# Patient Record
Sex: Female | Born: 1955 | Race: White | Hispanic: No | Smoking: Never smoker
Health system: Southern US, Community
[De-identification: ages and names within clinical notes are randomized; demographics above are authoritative.]

## PROBLEM LIST (undated history)

## (undated) DIAGNOSIS — E349 Endocrine disorder, unspecified: Secondary | ICD-10-CM

## (undated) DIAGNOSIS — T7840XA Allergy, unspecified, initial encounter: Secondary | ICD-10-CM

## (undated) DIAGNOSIS — M549 Dorsalgia, unspecified: Secondary | ICD-10-CM

## (undated) DIAGNOSIS — N3281 Overactive bladder: Secondary | ICD-10-CM

## (undated) DIAGNOSIS — G8929 Other chronic pain: Secondary | ICD-10-CM

## (undated) DIAGNOSIS — E079 Disorder of thyroid, unspecified: Secondary | ICD-10-CM

## (undated) DIAGNOSIS — R011 Cardiac murmur, unspecified: Secondary | ICD-10-CM

## (undated) DIAGNOSIS — C801 Malignant (primary) neoplasm, unspecified: Secondary | ICD-10-CM

## (undated) DIAGNOSIS — F419 Anxiety disorder, unspecified: Secondary | ICD-10-CM

## (undated) DIAGNOSIS — F32A Depression, unspecified: Secondary | ICD-10-CM

## (undated) DIAGNOSIS — I10 Essential (primary) hypertension: Secondary | ICD-10-CM

## (undated) DIAGNOSIS — E7800 Pure hypercholesterolemia, unspecified: Secondary | ICD-10-CM

## (undated) DIAGNOSIS — K219 Gastro-esophageal reflux disease without esophagitis: Secondary | ICD-10-CM

## (undated) DIAGNOSIS — J45909 Unspecified asthma, uncomplicated: Secondary | ICD-10-CM

## (undated) DIAGNOSIS — F329 Major depressive disorder, single episode, unspecified: Secondary | ICD-10-CM

## (undated) HISTORY — DX: Pure hypercholesterolemia, unspecified: E78.00

## (undated) HISTORY — DX: Other chronic pain: G89.29

## (undated) HISTORY — PX: TONSILLECTOMY AND ADENOIDECTOMY: SHX28

## (undated) HISTORY — DX: Allergy, unspecified, initial encounter: T78.40XA

## (undated) HISTORY — DX: Essential (primary) hypertension: I10

## (undated) HISTORY — PX: EXTERNAL EAR SURGERY: SHX627

## (undated) HISTORY — DX: Overactive bladder: N32.81

## (undated) HISTORY — DX: Malignant (primary) neoplasm, unspecified: C80.1

## (undated) HISTORY — DX: Depression, unspecified: F32.A

## (undated) HISTORY — DX: Cardiac murmur, unspecified: R01.1

## (undated) HISTORY — DX: Major depressive disorder, single episode, unspecified: F32.9

## (undated) HISTORY — PX: ABDOMINAL HYSTERECTOMY: SHX81

## (undated) HISTORY — PX: INNER EAR SURGERY: SHX679

## (undated) HISTORY — DX: Endocrine disorder, unspecified: E34.9

## (undated) HISTORY — PX: TUBAL LIGATION: SHX77

## (undated) HISTORY — DX: Gastro-esophageal reflux disease without esophagitis: K21.9

## (undated) HISTORY — DX: Unspecified asthma, uncomplicated: J45.909

## (undated) HISTORY — DX: Anxiety disorder, unspecified: F41.9

## (undated) HISTORY — DX: Dorsalgia, unspecified: M54.9

## (undated) HISTORY — DX: Disorder of thyroid, unspecified: E07.9

---

## 1998-12-12 ENCOUNTER — Encounter: Payer: Self-pay | Admitting: Family Medicine

## 1998-12-12 ENCOUNTER — Ambulatory Visit (HOSPITAL_COMMUNITY): Admission: RE | Admit: 1998-12-12 | Discharge: 1998-12-12 | Payer: Self-pay | Admitting: Family Medicine

## 1999-03-25 ENCOUNTER — Other Ambulatory Visit: Admission: RE | Admit: 1999-03-25 | Discharge: 1999-03-25 | Payer: Self-pay | Admitting: Gynecology

## 1999-12-18 ENCOUNTER — Encounter: Payer: Self-pay | Admitting: Gastroenterology

## 1999-12-18 ENCOUNTER — Ambulatory Visit (HOSPITAL_COMMUNITY): Admission: RE | Admit: 1999-12-18 | Discharge: 1999-12-18 | Payer: Self-pay | Admitting: Gastroenterology

## 2000-03-17 ENCOUNTER — Ambulatory Visit (HOSPITAL_COMMUNITY): Admission: RE | Admit: 2000-03-17 | Discharge: 2000-03-17 | Payer: Self-pay | Admitting: Gastroenterology

## 2000-03-17 ENCOUNTER — Encounter: Payer: Self-pay | Admitting: Gastroenterology

## 2000-05-02 ENCOUNTER — Encounter: Admission: RE | Admit: 2000-05-02 | Discharge: 2000-05-02 | Payer: Self-pay | Admitting: *Deleted

## 2000-05-13 ENCOUNTER — Other Ambulatory Visit: Admission: RE | Admit: 2000-05-13 | Discharge: 2000-05-13 | Payer: Self-pay | Admitting: Gynecology

## 2000-11-23 ENCOUNTER — Encounter: Payer: Self-pay | Admitting: Family Medicine

## 2000-11-23 ENCOUNTER — Ambulatory Visit (HOSPITAL_COMMUNITY): Admission: RE | Admit: 2000-11-23 | Discharge: 2000-11-23 | Payer: Self-pay | Admitting: Family Medicine

## 2000-12-20 ENCOUNTER — Encounter: Admission: RE | Admit: 2000-12-20 | Discharge: 2000-12-20 | Payer: Self-pay | Admitting: General Surgery

## 2000-12-20 ENCOUNTER — Encounter: Payer: Self-pay | Admitting: General Surgery

## 2000-12-27 ENCOUNTER — Encounter: Payer: Self-pay | Admitting: General Surgery

## 2000-12-27 ENCOUNTER — Encounter (INDEPENDENT_AMBULATORY_CARE_PROVIDER_SITE_OTHER): Payer: Self-pay | Admitting: Specialist

## 2000-12-27 ENCOUNTER — Ambulatory Visit (HOSPITAL_COMMUNITY): Admission: RE | Admit: 2000-12-27 | Discharge: 2000-12-27 | Payer: Self-pay | Admitting: General Surgery

## 2001-01-30 ENCOUNTER — Observation Stay (HOSPITAL_COMMUNITY): Admission: RE | Admit: 2001-01-30 | Discharge: 2001-01-31 | Payer: Self-pay

## 2001-01-30 ENCOUNTER — Encounter (INDEPENDENT_AMBULATORY_CARE_PROVIDER_SITE_OTHER): Payer: Self-pay

## 2001-05-03 ENCOUNTER — Encounter: Admission: RE | Admit: 2001-05-03 | Discharge: 2001-05-03 | Payer: Self-pay | Admitting: Gynecology

## 2001-05-03 ENCOUNTER — Encounter: Payer: Self-pay | Admitting: Gynecology

## 2001-05-24 ENCOUNTER — Other Ambulatory Visit: Admission: RE | Admit: 2001-05-24 | Discharge: 2001-05-24 | Payer: Self-pay | Admitting: Gynecology

## 2002-03-12 ENCOUNTER — Encounter: Admission: RE | Admit: 2002-03-12 | Discharge: 2002-06-10 | Payer: Self-pay | Admitting: Family Medicine

## 2002-05-17 ENCOUNTER — Encounter: Payer: Self-pay | Admitting: Gynecology

## 2002-05-17 ENCOUNTER — Encounter: Admission: RE | Admit: 2002-05-17 | Discharge: 2002-05-17 | Payer: Self-pay | Admitting: Gynecology

## 2002-05-24 ENCOUNTER — Other Ambulatory Visit: Admission: RE | Admit: 2002-05-24 | Discharge: 2002-05-24 | Payer: Self-pay | Admitting: Gynecology

## 2002-06-12 ENCOUNTER — Encounter: Payer: Self-pay | Admitting: Gynecology

## 2002-06-12 ENCOUNTER — Encounter: Admission: RE | Admit: 2002-06-12 | Discharge: 2002-06-12 | Payer: Self-pay | Admitting: Gynecology

## 2003-05-21 ENCOUNTER — Encounter: Admission: RE | Admit: 2003-05-21 | Discharge: 2003-05-21 | Payer: Self-pay | Admitting: Gynecology

## 2003-05-21 ENCOUNTER — Encounter: Payer: Self-pay | Admitting: Gynecology

## 2003-09-17 ENCOUNTER — Other Ambulatory Visit: Admission: RE | Admit: 2003-09-17 | Discharge: 2003-09-17 | Payer: Self-pay | Admitting: Gynecology

## 2004-06-09 ENCOUNTER — Encounter: Admission: RE | Admit: 2004-06-09 | Discharge: 2004-06-09 | Payer: Self-pay | Admitting: Gynecology

## 2004-10-12 ENCOUNTER — Encounter: Admission: RE | Admit: 2004-10-12 | Discharge: 2004-10-12 | Payer: Self-pay | Admitting: Otolaryngology

## 2004-10-23 ENCOUNTER — Other Ambulatory Visit: Admission: RE | Admit: 2004-10-23 | Discharge: 2004-10-23 | Payer: Self-pay | Admitting: Gynecology

## 2005-07-13 ENCOUNTER — Encounter: Admission: RE | Admit: 2005-07-13 | Discharge: 2005-07-13 | Payer: Self-pay | Admitting: Gynecology

## 2005-11-09 ENCOUNTER — Other Ambulatory Visit: Admission: RE | Admit: 2005-11-09 | Discharge: 2005-11-09 | Payer: Self-pay | Admitting: Gynecology

## 2006-07-18 ENCOUNTER — Encounter: Admission: RE | Admit: 2006-07-18 | Discharge: 2006-07-18 | Payer: Self-pay | Admitting: Gynecology

## 2006-11-23 ENCOUNTER — Other Ambulatory Visit: Admission: RE | Admit: 2006-11-23 | Discharge: 2006-11-23 | Payer: Self-pay | Admitting: Gynecology

## 2006-12-30 ENCOUNTER — Encounter: Admission: RE | Admit: 2006-12-30 | Discharge: 2006-12-30 | Payer: Self-pay | Admitting: Gastroenterology

## 2007-07-20 ENCOUNTER — Encounter: Admission: RE | Admit: 2007-07-20 | Discharge: 2007-07-20 | Payer: Self-pay | Admitting: Gynecology

## 2007-08-22 IMAGING — CR DG UGI W/ SMALL BOWEL HIGH DENSITY
18 of 24 series · 18 of 24 positions shown · non-contrast
Comparison: [HOSPITAL] abdominal and pelvic CT report, 11/23/00.

CLINICAL DATA: Slight upper esophageal intermittent dysphagia (previous thyroid surgery).  Intermittent blood in stools and spitting up blood.  Alternating constipation and diarrhea, right flank pain, positive H. pylori.  Diet-controlled diabetes.
UPPER GI WITH SMALL BOWEL FOLLOW THROUGH, HIGH DENSITY:
TECHNIQUE: Double and single contrast barium.
The preliminary abdominal radiograph is unremarkable.
The esophagus is normal in appearance. No intrinsic nor extrinsic esophageal lesion is seen, specifically in region of prior thyroidectomy.  Minimal tertiary contractions are seen with normal primary peristaltic wave of barium swallow.  Ingested 13 mm barium tablet readily passes through stomach.  There is no evidence of hiatal hernia.  Esophageal motility is within normal limits. The stomach, duodenal bulb, and remainder of the duodenum are normal in appearance.  Minimal water siphon induced gastroesophageal reflux was demonstrated.
Small bowel transit  time was normal at 40 minutes.  There were no abnormally dilated loops of large or small bowel present.  The mucosal pattern of the small bowel was normal.  The terminal ileum was adequately visualized and was normal in appearance. Appendix is normal.

[run (1 of 17)]
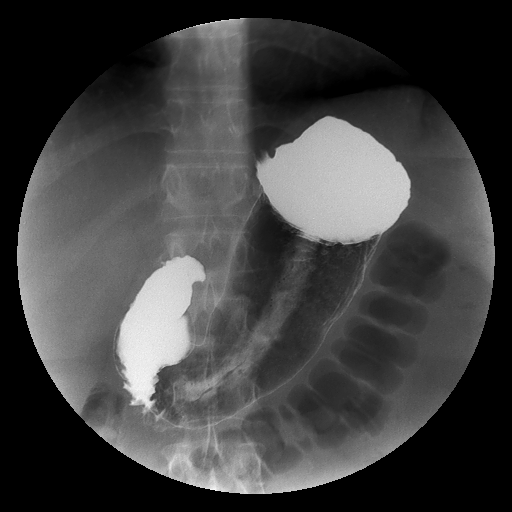

[run (2 of 17)]
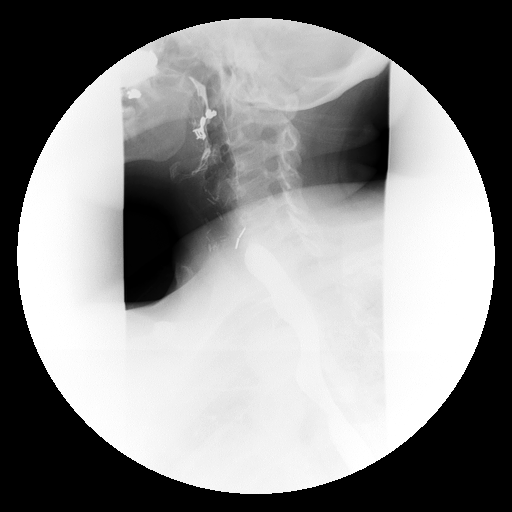

[run (3 of 17)]
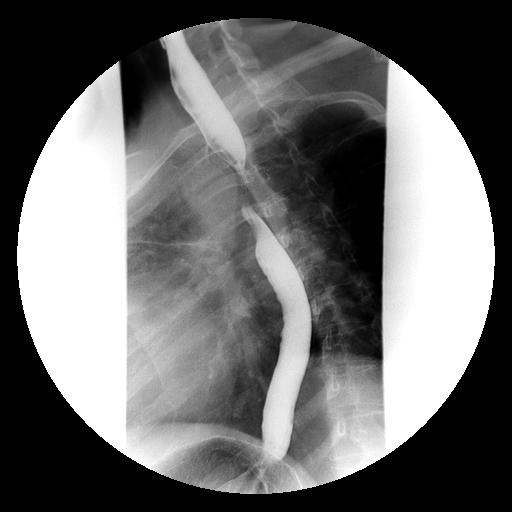

[run (4 of 17)]
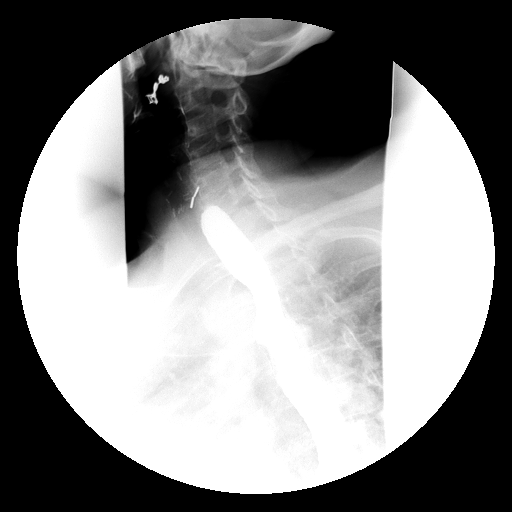

[run (5 of 17)]
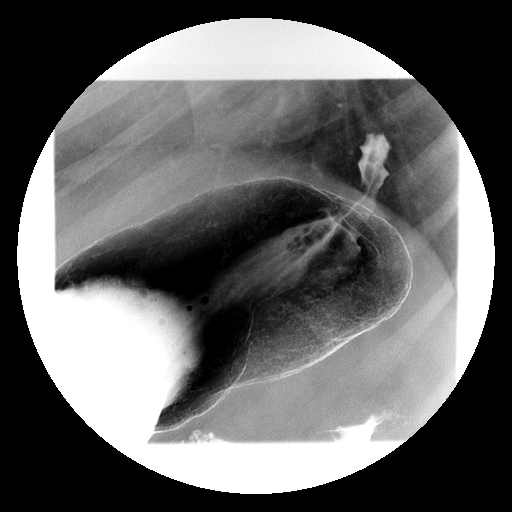

[run (6 of 17)]
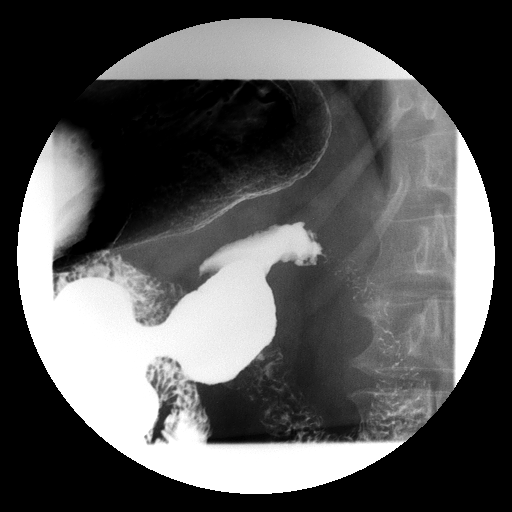

[run (7 of 17)]
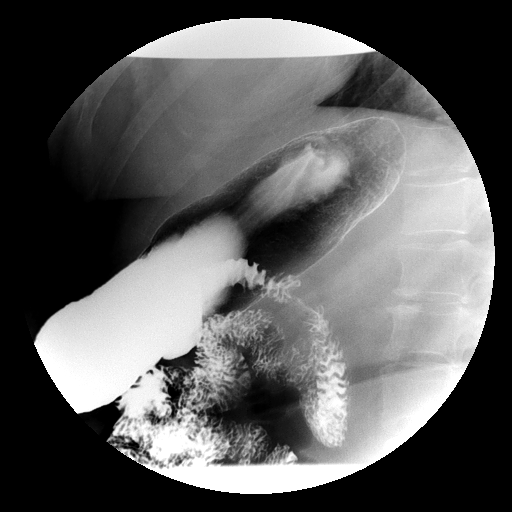

[run (8 of 17)]
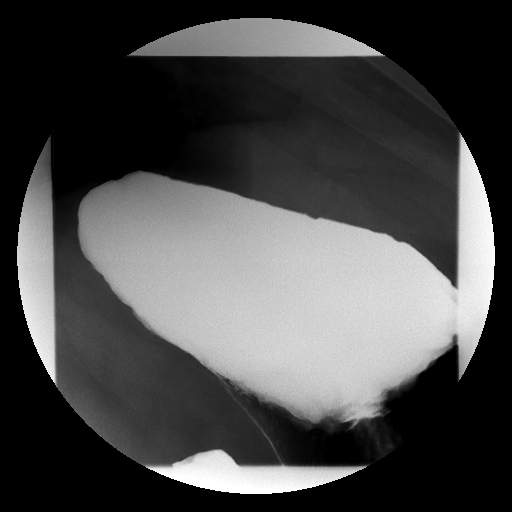

[run (9 of 17)]
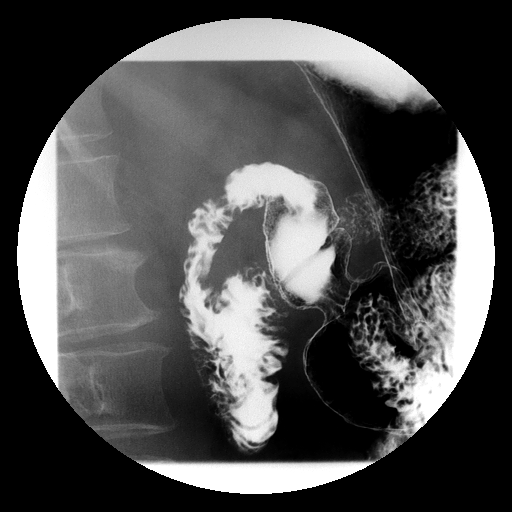

[run (10 of 17)]
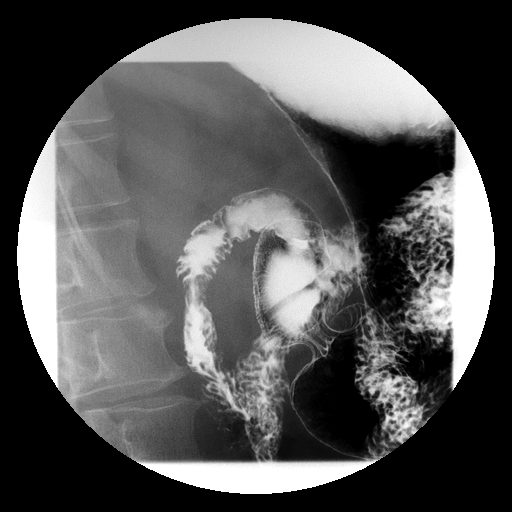

[run (11 of 17)]
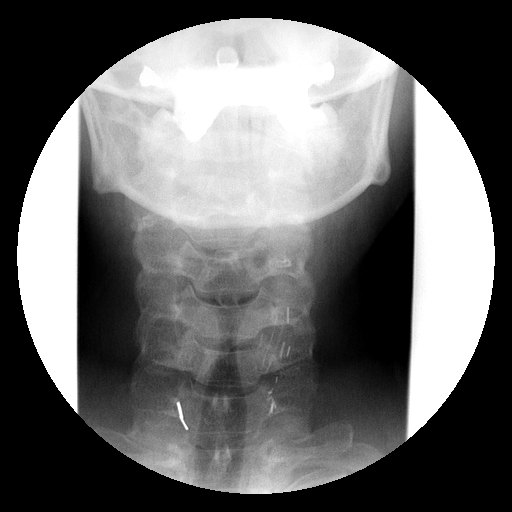

[run (12 of 17)]
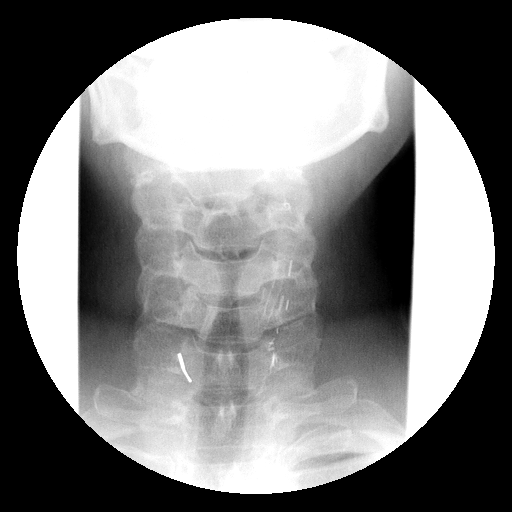

[run (13 of 17)]
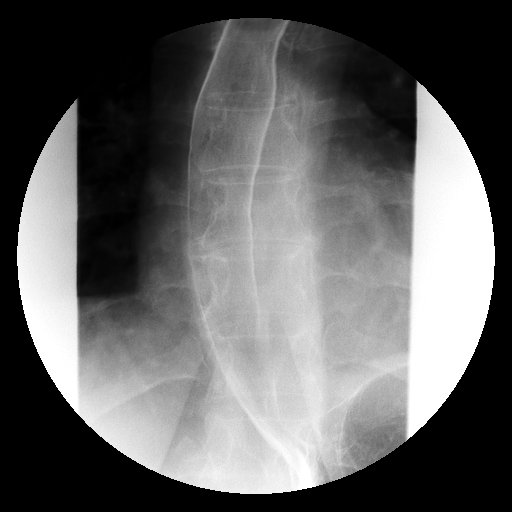

[run (14 of 17)]
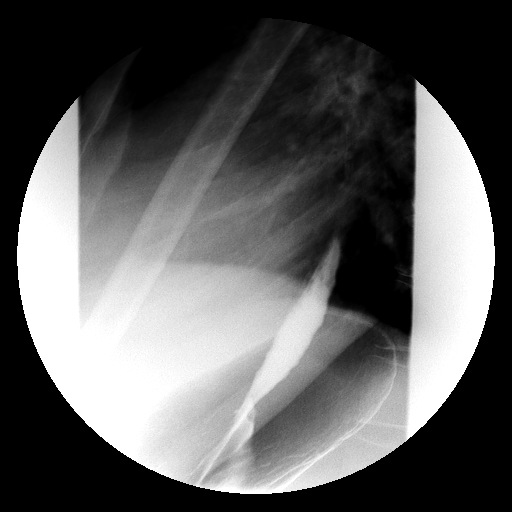

[run (15 of 17)]
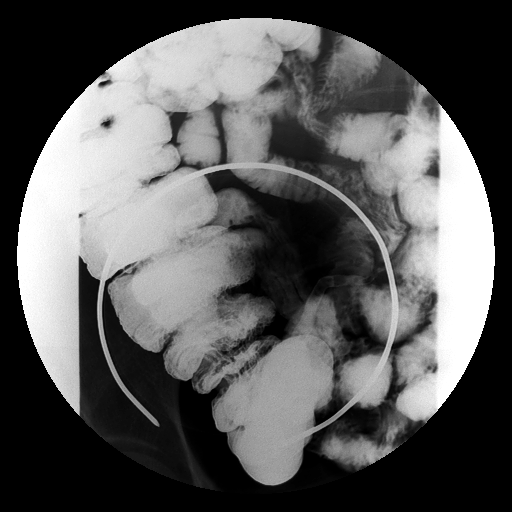

[run (16 of 17)]
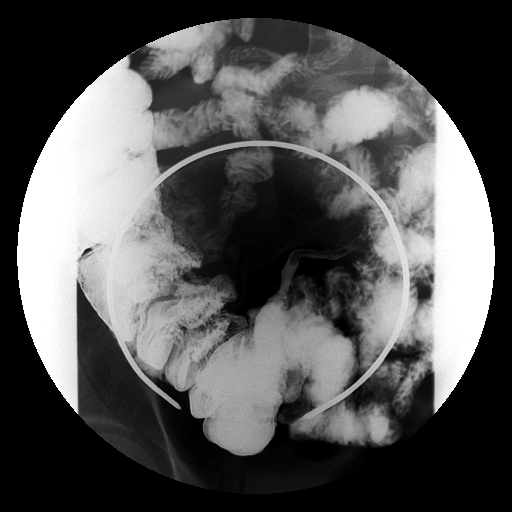

[run (17 of 17)]
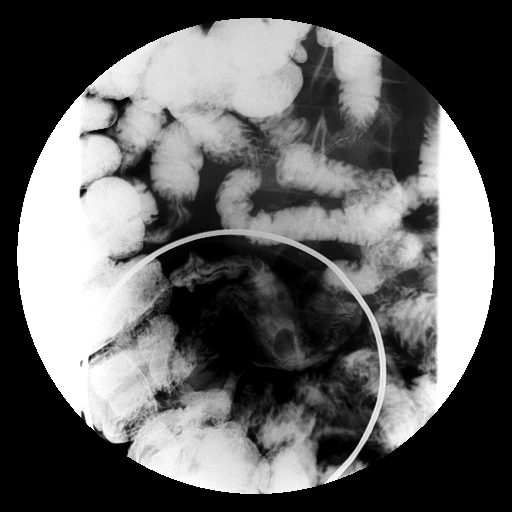

[view not recorded]
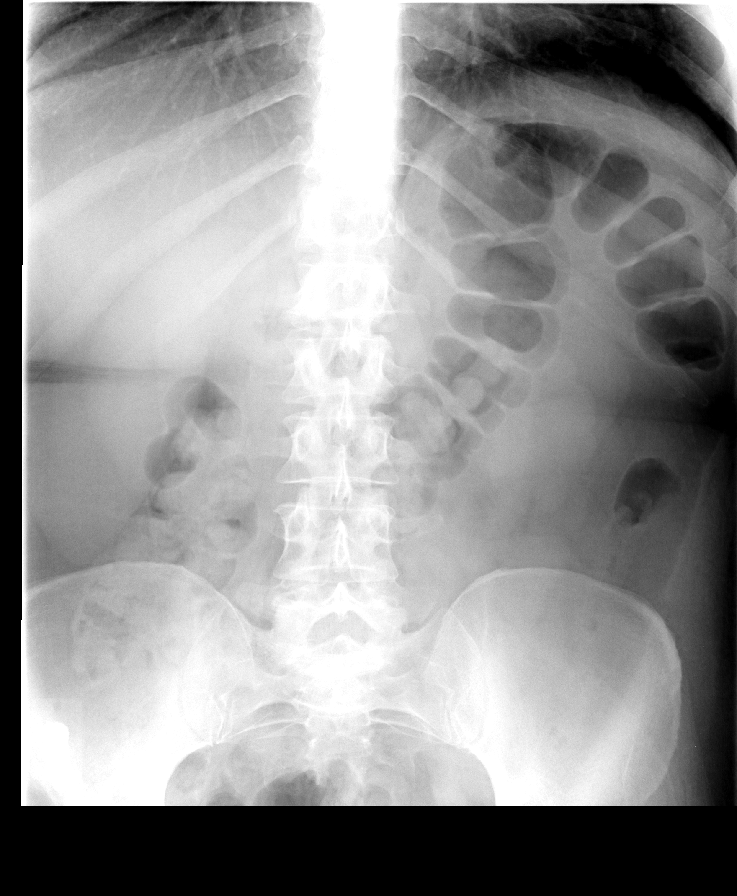

[18 of 24 positions shown; findings below may reference images not displayed]

IMPRESSION: Minimal esophageal tertiary contractions and minimal water siphon induced gastroesophageal reflux. Otherwise, normal upper gi and small bowel series.

## 2007-11-24 ENCOUNTER — Other Ambulatory Visit: Admission: RE | Admit: 2007-11-24 | Discharge: 2007-11-24 | Payer: Self-pay | Admitting: Gynecology

## 2008-07-22 ENCOUNTER — Encounter: Admission: RE | Admit: 2008-07-22 | Discharge: 2008-07-22 | Payer: Self-pay | Admitting: Gynecology

## 2008-11-27 ENCOUNTER — Encounter: Payer: Self-pay | Admitting: Gynecology

## 2008-11-27 ENCOUNTER — Ambulatory Visit: Payer: Self-pay | Admitting: Gynecology

## 2008-11-27 ENCOUNTER — Other Ambulatory Visit: Admission: RE | Admit: 2008-11-27 | Discharge: 2008-11-27 | Payer: Self-pay | Admitting: Gynecology

## 2008-12-04 ENCOUNTER — Ambulatory Visit: Payer: Self-pay | Admitting: Gynecology

## 2009-07-23 ENCOUNTER — Encounter: Admission: RE | Admit: 2009-07-23 | Discharge: 2009-07-23 | Payer: Self-pay | Admitting: Gynecology

## 2009-12-10 ENCOUNTER — Other Ambulatory Visit: Admission: RE | Admit: 2009-12-10 | Discharge: 2009-12-10 | Payer: Self-pay | Admitting: Gynecology

## 2009-12-10 ENCOUNTER — Ambulatory Visit: Payer: Self-pay | Admitting: Gynecology

## 2009-12-19 ENCOUNTER — Ambulatory Visit: Payer: Self-pay | Admitting: Gynecology

## 2009-12-29 ENCOUNTER — Ambulatory Visit: Payer: Self-pay | Admitting: Gynecology

## 2010-01-01 ENCOUNTER — Encounter: Admission: RE | Admit: 2010-01-01 | Discharge: 2010-01-01 | Payer: Self-pay | Admitting: Family Medicine

## 2010-02-17 ENCOUNTER — Ambulatory Visit: Payer: Self-pay | Admitting: Gynecology

## 2010-07-16 HISTORY — PX: COLONOSCOPY: SHX174

## 2010-07-29 ENCOUNTER — Encounter: Admission: RE | Admit: 2010-07-29 | Discharge: 2010-07-29 | Payer: Self-pay | Admitting: Gynecology

## 2011-01-15 ENCOUNTER — Other Ambulatory Visit: Payer: Self-pay | Admitting: Otolaryngology

## 2011-01-15 DIAGNOSIS — J329 Chronic sinusitis, unspecified: Secondary | ICD-10-CM

## 2011-01-21 ENCOUNTER — Other Ambulatory Visit (HOSPITAL_COMMUNITY)
Admission: RE | Admit: 2011-01-21 | Discharge: 2011-01-21 | Disposition: A | Discharge status: Home / Self Care | Payer: BC Managed Care – PPO | Source: Ambulatory Visit | Attending: Gynecology | Admitting: Gynecology

## 2011-01-21 ENCOUNTER — Ambulatory Visit
Admission: RE | Admit: 2011-01-21 | Discharge: 2011-01-21 | Disposition: A | Discharge status: Home / Self Care | Payer: BC Managed Care – PPO | Source: Ambulatory Visit | Attending: Otolaryngology | Admitting: Otolaryngology

## 2011-01-21 ENCOUNTER — Encounter (INDEPENDENT_AMBULATORY_CARE_PROVIDER_SITE_OTHER): Payer: BC Managed Care – PPO | Admitting: Gynecology

## 2011-01-21 ENCOUNTER — Other Ambulatory Visit: Payer: Self-pay | Admitting: Gynecology

## 2011-01-21 DIAGNOSIS — J329 Chronic sinusitis, unspecified: Secondary | ICD-10-CM

## 2011-01-21 DIAGNOSIS — Z01419 Encounter for gynecological examination (general) (routine) without abnormal findings: Secondary | ICD-10-CM

## 2011-01-21 DIAGNOSIS — Z1211 Encounter for screening for malignant neoplasm of colon: Secondary | ICD-10-CM

## 2011-01-21 DIAGNOSIS — Z124 Encounter for screening for malignant neoplasm of cervix: Secondary | ICD-10-CM | POA: Insufficient documentation

## 2011-01-29 ENCOUNTER — Other Ambulatory Visit (INDEPENDENT_AMBULATORY_CARE_PROVIDER_SITE_OTHER): Payer: BC Managed Care – PPO

## 2011-01-29 DIAGNOSIS — R7309 Other abnormal glucose: Secondary | ICD-10-CM

## 2011-02-05 NOTE — Op Note (Signed)
[facility]  Patient:    Carrie Macias, Carrie Macias                       MRN: [id number] Proc. Date: 01/30/01 Adm. Date:  [id number] Attending:  Gennie Alma CC:         Arvella Merles, M.D.   Operative Report  [place] Number:  [phone]  PREOPERATIVE DIAGNOSES: 1. 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 2. 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  POSTOPERATIVE DIAGNOSES: 1. Hashimotos thyroiditis, left lobe of thyroid. 2. 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  OPERATION:  Left thyroid lobectomy.  SURGEON:  Milus Mallick, M.D.  ASSISTANTRiley Lam A. Magnus Ivan, M.D.  ANESTHESIA:  General endotracheal.  DESCRIPTION OF PROCEDURE:  Under adequate general endotracheal anesthesia, the patients neck was extended over a shoulder roll and then prepared and draped in the usual fashion.  There was a low collar incisional scar present.  An incision was made in the previous scar and carried down through the scar tissue to the fascia of the neck.  Bleeders were electrocoagulated.  One significant vein was divided over hemostats which were ligated with 3-0 silk. The deep fascia was incised, and then the strap muscles of the left side of the neck were divided, exposing the surgical plane of the left lobe and the thyroid.  It was somewhat laden with adhesions, but these could be divided with blunt and some sharp dissection.  A significant middle thyroid vein was exposed and divided between quadruple clip technique.  This allowed the left lobe of the thyroid to be located and retracted anteriorly and medially.  The left inferior parathyroid gland was identified and spared of any injury during the subsequent dissection.  The inferior pole attachments were divided over hemostats, and in some instances, electrocoagulated.  Next the superior pole vessels were divided over  a 3-0 silk ligature and clip applied to the stay side and a ligature of 3-0 silk through the specimen side.  This gave a great deal of mobility to the left lobe of the thyroid, and it was dissected up off of the surrounding structures.  The branches of the inferior thyroid artery were divided over hemoclips.  The dissection was carried out on the capsule of the thyroid, and at no time was the recurrent laryngeal nerve encountered.  The ligament of Allyson Sabal was divided with Bovie electrocoagulation, and the small area of attachment to the trachea was also divided similarly.  The specimen was removed from the operative field and sent for frozen section study which revealed Hashimotos thyroiditis.  Hemostasis was ascertained.  There was a little oozing from the area which the recurrent laryngeal nerve usually runs, and this was controlled with Surgicel.  The superior parathyroid gland was never encountered.  The strap muscles were repaired with interrupted sutures of 4-0 Vicryl.  The subcutaneous tissue of the neck was similarly repaired.  The skin was approximated with the generic skin staples 35-W.  Sterile dressing was applied.  Estimated blood loss for the procedure was approximately 50 cc.  The patient tolerated the procedure well and left the operating room in satisfactory condition. DD:  01/30/01 TD:  01/30/01 Job: [id number]

## 2011-03-05 ENCOUNTER — Ambulatory Visit
Admission: RE | Admit: 2011-03-05 | Discharge: 2011-03-05 | Disposition: A | Discharge status: Home / Self Care | Payer: BC Managed Care – PPO | Source: Ambulatory Visit | Attending: Family Medicine | Admitting: Family Medicine

## 2011-03-05 ENCOUNTER — Other Ambulatory Visit: Payer: Self-pay | Admitting: Family Medicine

## 2011-03-05 DIAGNOSIS — I10 Essential (primary) hypertension: Secondary | ICD-10-CM

## 2011-05-13 ENCOUNTER — Other Ambulatory Visit: Payer: Self-pay | Admitting: Otolaryngology

## 2011-07-27 ENCOUNTER — Other Ambulatory Visit: Payer: Self-pay | Admitting: Gynecology

## 2011-07-27 DIAGNOSIS — Z1231 Encounter for screening mammogram for malignant neoplasm of breast: Secondary | ICD-10-CM

## 2011-08-06 ENCOUNTER — Ambulatory Visit
Admission: RE | Admit: 2011-08-06 | Discharge: 2011-08-06 | Disposition: A | Discharge status: Home / Self Care | Payer: BC Managed Care – PPO | Source: Ambulatory Visit | Attending: Gynecology | Admitting: Gynecology

## 2011-08-06 DIAGNOSIS — Z1231 Encounter for screening mammogram for malignant neoplasm of breast: Secondary | ICD-10-CM

## 2011-10-26 IMAGING — CR DG CHEST 2V
2 series · 2 of 2 positions shown · non-contrast
Comparison: [HOSPITAL] at [REDACTED] [HOSPITAL] chest x-ray
report 12/20/2000.

CLINICAL DATA: Hypertension, preop for years surgery.  History
thyroid cancer, hypertension, diabetes, nonsmoker, no current chest
complaints.

CHEST - 2 VIEW

[view not recorded (1 of 2)]
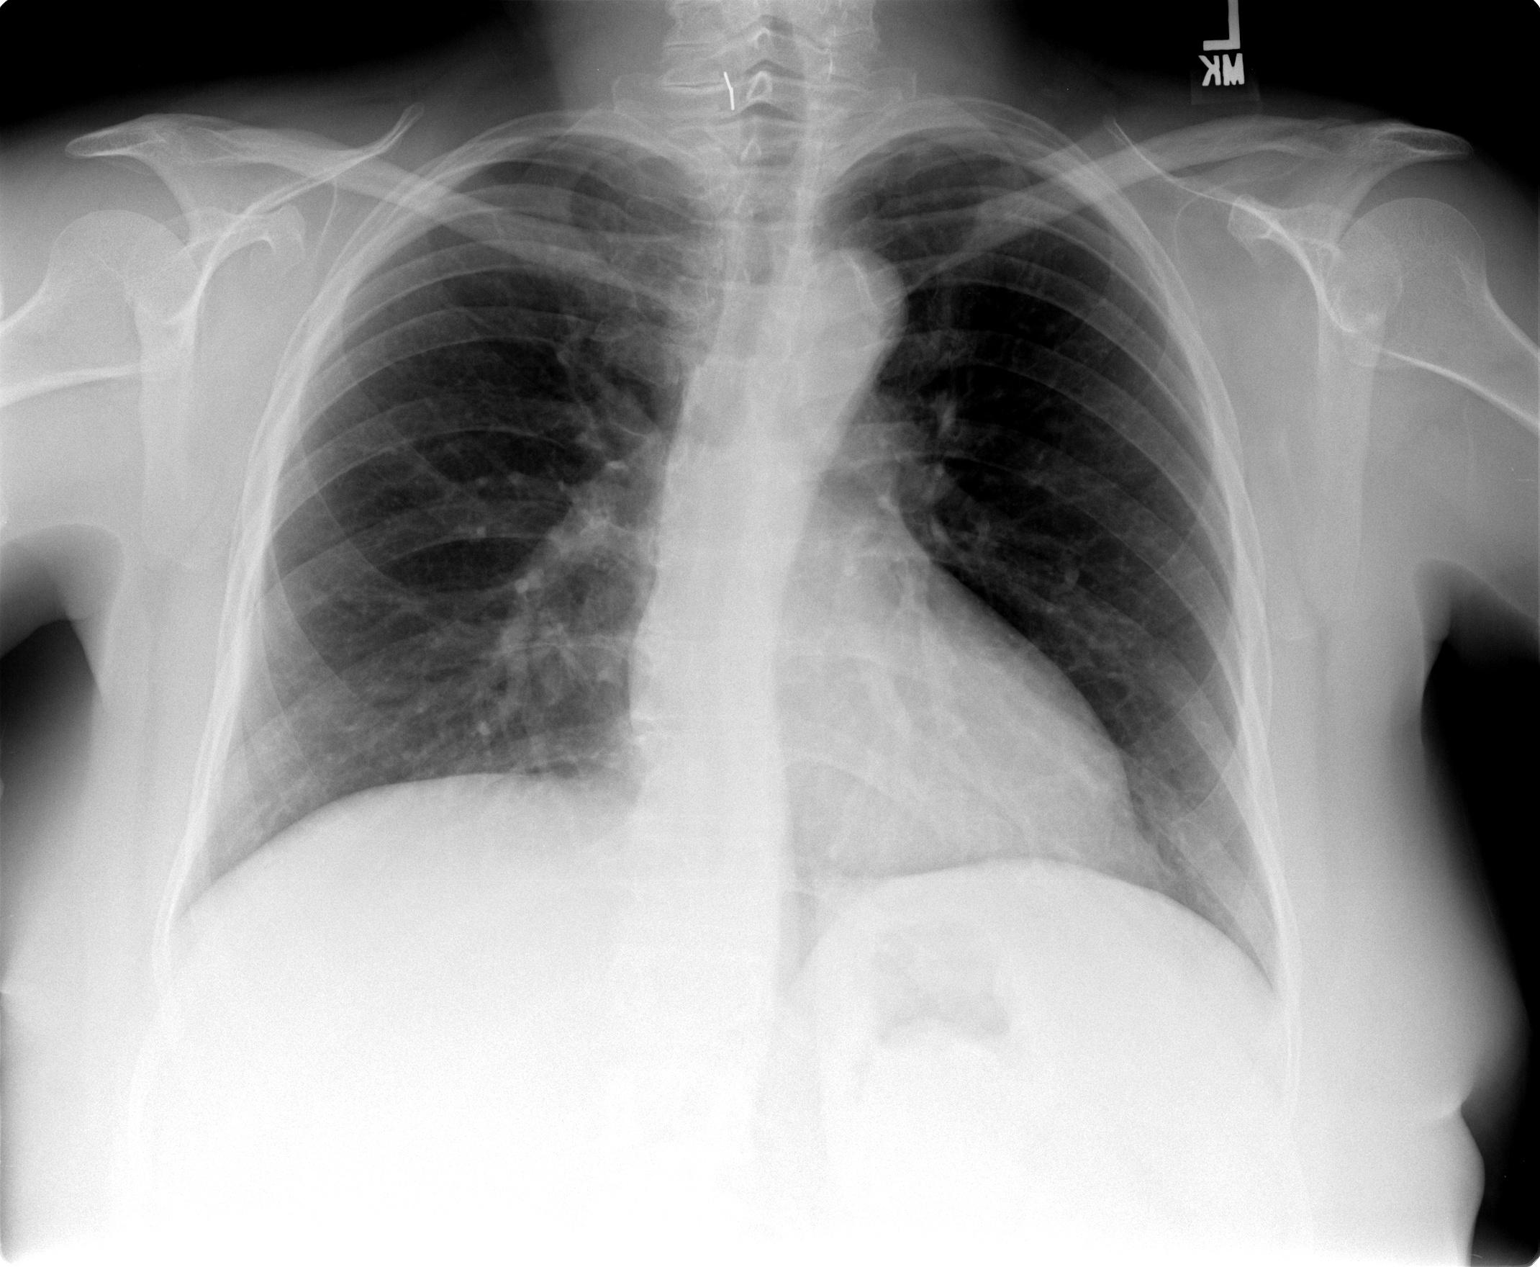

[view not recorded (2 of 2)]
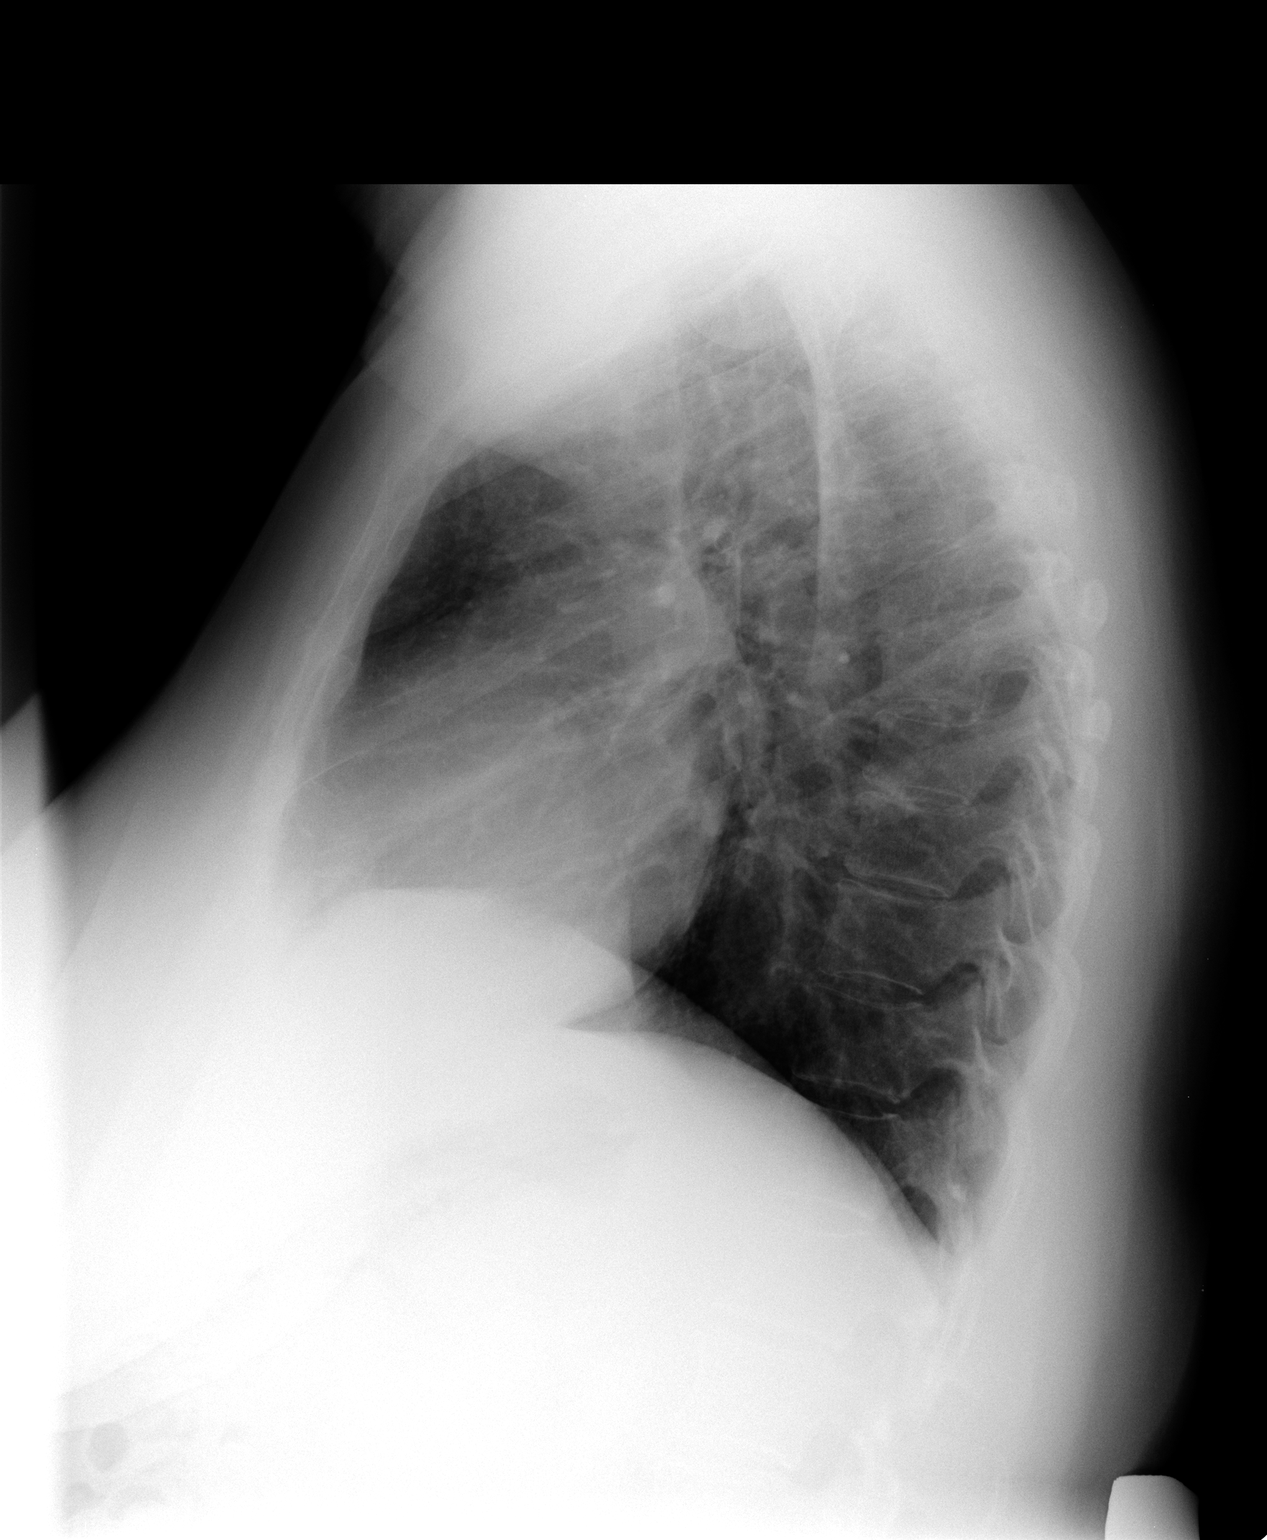

[2 of 2 positions shown; findings below may reference images not displayed]

FINDINGS: Surgical clip right thyroid bed visualized.

Lungs are clear.

Heart size normal.

Mediastinum, hila, pleura and osseous structures are unremarkable.
IMPRESSION: No active cardiopulmonary or metastatic disease.

## 2012-01-24 DIAGNOSIS — E7800 Pure hypercholesterolemia, unspecified: Secondary | ICD-10-CM | POA: Insufficient documentation

## 2012-01-24 DIAGNOSIS — G8929 Other chronic pain: Secondary | ICD-10-CM | POA: Insufficient documentation

## 2012-01-27 ENCOUNTER — Ambulatory Visit (INDEPENDENT_AMBULATORY_CARE_PROVIDER_SITE_OTHER): Payer: BC Managed Care – PPO | Admitting: Gynecology

## 2012-01-27 ENCOUNTER — Encounter: Payer: Self-pay | Admitting: Gynecology

## 2012-01-27 VITALS — BP 144/92 | Ht 63.5 in | Wt 168.0 lb

## 2012-01-27 DIAGNOSIS — N951 Menopausal and female climacteric states: Secondary | ICD-10-CM

## 2012-01-27 DIAGNOSIS — Z87898 Personal history of other specified conditions: Secondary | ICD-10-CM

## 2012-01-27 DIAGNOSIS — Z01419 Encounter for gynecological examination (general) (routine) without abnormal findings: Secondary | ICD-10-CM

## 2012-01-27 DIAGNOSIS — Z8639 Personal history of other endocrine, nutritional and metabolic disease: Secondary | ICD-10-CM

## 2012-01-27 MED ORDER — ESTRADIOL 1 MG PO TABS: 1.0000 mg | ORAL_TABLET | Freq: Every day | ORAL | Status: DC

## 2012-01-27 NOTE — Patient Instructions (Signed)
Remember to take your medication today.

## 2012-01-27 NOTE — Progress Notes (Signed)
Carrie Macias 07-Dec-1955 [id number]   History:    56 y.o.  for annual exam with complaints of hot flashes and sweating. Patient had been on elestrin transdermal estrogen but had discontinued because of the cost. Patient with prior history of laparoscopic vaginal hysterectomy with right salpingo-oophorectomy along with a suburethral sling and anterior colporrhaphy. Review of her record indicates she was weighing 172 now weighing 168 she started to exercise more. Review of her record indicated that her blood pressures a was 144/92 (she did not take her medications as morning) last Pap smear was in 2012. Her primary physician that all her lab work recently. She does have history vitamin D deficiency this was not tested. She is currently taking calcium and vitamin D. Her last bone density study was in 2011 with her lowest T score at the right femoral neck with a value of -1.3 (decrease mineralization/osteopenia). Her last mammogram was in November 2012 which was normal and she does her monthly self breast examination.  Past medical history,surgical history, family history and social history were all reviewed and documented in the EPIC chart.  Gynecologic History No LMP recorded. Patient has had a hysterectomy. Contraception: none Last Pap: 2012. Results were: normal Last mammogram: 2012. Results were: normal  Obstetric History OB History    Grav Para Term Preterm Abortions TAB SAB Ect Mult Living   3 2 2  1  1   2      # Outc Date GA Lbr Len/2nd Wgt Sex Del Anes PTL Lv   1 TRM     M SVD  No Yes   2 SAB            3 TRM     F SVD  No Yes       ROS:  Was performed and pertinent positives and negatives are included in the history.  Exam: chaperone present  BP 144/92  Ht 5' 3.5" (1.613 m)  Wt 168 lb (76.204 kg)  BMI 29.29 kg/m2  Body mass index is 29.29 kg/(m^2).  General appearance : Well developed well nourished female. No acute distress HEENT: Neck supple, trachea midline, no carotid  bruits, no thyroidmegaly Lungs: Clear to auscultation, no rhonchi or wheezes, or rib retractions  Heart: Regular rate and rhythm, no murmurs or gallops Breast:Examined in sitting and supine position were symmetrical in appearance, no palpable masses or tenderness,  no skin retraction, no nipple inversion, no nipple discharge, no skin discoloration, no axillary or supraclavicular lymphadenopathy Abdomen: no palpable masses or tenderness, no rebound or guarding Extremities: no edema or skin discoloration or tenderness  Pelvic:  Bartholin, Urethra, Skene Glands: Within normal limits             Vagina: No gross lesions or discharge  Cervix: Absent  Uterus absent Adnexa  Without masses or tenderness  Anus and perineum  normal   Rectovaginal  normal sphincter tone without palpated masses or tenderness             Hemoccult cards provided     Assessment/Plan:  56 y.o. female for annual exam with menopausal symptoms worsened recently as a result of discontinuation of her transdermal elestrin due to cost. Patient will be placed on Estrace 1 mg by mouth daily generic form and she will start that today. She was reminded to start taking her blood pressure medication today that she forgot. Her last colonoscopy was normal in 2011. Requisition to schedule her bone density was provided. We'll check her vitamin D  level. New Pap smear screening guidelines discussed. Patient will no longer need Pap smears. She's had normal Pap smears all her life and has had a hysterectomy for benign pathology. Her mammogram is due at the end of the year she was reminded to continue to do her monthly self breast examination and to submit to the office the Hemoccult cards provided to her today.    Ok Edwards MD, 10:11 AM 01/27/2012

## 2012-01-28 ENCOUNTER — Other Ambulatory Visit: Payer: Self-pay | Admitting: *Deleted

## 2012-01-28 MED ORDER — ESTRADIOL 1 MG PO TABS: 1.0000 mg | ORAL_TABLET | Freq: Every day | ORAL | Status: DC

## 2012-02-03 ENCOUNTER — Telehealth: Payer: Self-pay | Admitting: *Deleted

## 2012-02-03 NOTE — Telephone Encounter (Signed)
Pt called stating pharmacy never rx for estradiol 1 mg daily called in to pharmacy. Pt informed.

## 2012-02-10 ENCOUNTER — Ambulatory Visit (INDEPENDENT_AMBULATORY_CARE_PROVIDER_SITE_OTHER): Payer: BC Managed Care – PPO

## 2012-02-10 ENCOUNTER — Telehealth: Payer: Self-pay | Admitting: *Deleted

## 2012-02-10 DIAGNOSIS — M8580 Other specified disorders of bone density and structure, unspecified site: Secondary | ICD-10-CM

## 2012-02-10 DIAGNOSIS — Z8639 Personal history of other endocrine, nutritional and metabolic disease: Secondary | ICD-10-CM

## 2012-02-10 DIAGNOSIS — N951 Menopausal and female climacteric states: Secondary | ICD-10-CM

## 2012-02-10 DIAGNOSIS — M899 Disorder of bone, unspecified: Secondary | ICD-10-CM

## 2012-02-10 NOTE — Telephone Encounter (Signed)
Pt spoke with wendy regarding if JF could fill out disability papers due to back issues? Pt would like to file for disability, Left message for pt to call

## 2012-02-11 NOTE — Telephone Encounter (Signed)
Spoke with pt regarding the below note and told her that JF would not be able to fill out paper for her back issues. Pt said she will follow up with PCP. Dr.harris office.

## 2012-02-17 ENCOUNTER — Encounter: Payer: Self-pay | Admitting: Gynecology

## 2012-06-29 ENCOUNTER — Other Ambulatory Visit: Payer: Self-pay | Admitting: Gynecology

## 2012-06-29 DIAGNOSIS — Z1231 Encounter for screening mammogram for malignant neoplasm of breast: Secondary | ICD-10-CM

## 2012-08-09 ENCOUNTER — Ambulatory Visit
Admission: RE | Admit: 2012-08-09 | Discharge: 2012-08-09 | Disposition: A | Discharge status: Home / Self Care | Payer: BC Managed Care – PPO | Source: Ambulatory Visit | Attending: Gynecology | Admitting: Gynecology

## 2012-08-09 DIAGNOSIS — Z1231 Encounter for screening mammogram for malignant neoplasm of breast: Secondary | ICD-10-CM

## 2012-10-15 ENCOUNTER — Ambulatory Visit (INDEPENDENT_AMBULATORY_CARE_PROVIDER_SITE_OTHER): Payer: BC Managed Care – PPO | Admitting: Emergency Medicine

## 2012-10-15 ENCOUNTER — Ambulatory Visit: Payer: BC Managed Care – PPO

## 2012-10-15 VITALS — BP 169/95 | HR 76 | Temp 97.4°F | Resp 18 | Ht 63.0 in | Wt 178.8 lb

## 2012-10-15 DIAGNOSIS — R059 Cough, unspecified: Secondary | ICD-10-CM

## 2012-10-15 DIAGNOSIS — J209 Acute bronchitis, unspecified: Secondary | ICD-10-CM

## 2012-10-15 DIAGNOSIS — R05 Cough: Secondary | ICD-10-CM

## 2012-10-15 MED ORDER — AZITHROMYCIN 250 MG PO TABS: ORAL_TABLET | ORAL | Status: DC

## 2012-10-15 MED ORDER — HYDROCOD POLST-CHLORPHEN POLST 10-8 MG/5ML PO LQCR: 5.0000 mL | Freq: Two times a day (BID) | ORAL | Status: DC | PRN

## 2012-10-15 NOTE — Progress Notes (Signed)
  Subjective:    Patient ID: Carrie Macias, female    DOB: [birth year], 57 y.o.   MRN: [id number]  HPI    Review of Systems     Objective:   Physical Exam        Assessment & Plan:    [facility] reading (PRIMARY) by  Dr. Dareen Piano.  Negative chest.

## 2012-10-15 NOTE — Progress Notes (Signed)
[facility] [address] [place] [address] [phone]  Date:  10/15/2012   Name:  Carrie Macias   DOB:  1956-01-30   MRN:  [id number]  PCP:  No primary provider on file.    Chief Complaint: URI   History of Present Illness:  Carrie Macias is a 57 y.o. very pleasant female patient who presents with the following:  Ill for one month with a cough.  Has been through two rounds of augmentin.  Has a cough productive of yellow thick sputum not associated with wheezing or shortness of breath.  Moderate nasal congestion and drainage and a post nasal drip with thick dark yellow drainage.  No fever or chills.  Cough is barking.  No sore throat, nausea or vomiting. No other complaints.  Patient Active Problem List  Diagnosis  . Hypercholesterolemia  . Back pain, chronic  . History of vitamin D deficiency    Past Medical History  Diagnosis Date  . Hormone disorder     HYPOTHYROIDISM  . Hypertension   . Detrusor dyssynergia   . Anxiety   . Hypercholesterolemia   . Back pain, chronic   . Diabetes mellitus     TYPE II  . Cancer     THYROID  . Allergy   . Depression   . GERD (gastroesophageal reflux disease)   . Asthma   . Thyroid disease   . Heart murmur     Past Surgical History  Procedure Date  . Tonsillectomy and adenoidectomy   . Tubal ligation   . External ear surgery   . Abdominal hysterectomy     LAVH/RSO/SLING/ANT/COLPO  . Colonoscopy 07/16/2010  . Inner ear surgery     IMPLANT    History  Substance Use Topics  . Smoking status: Never Smoker   . Smokeless tobacco: Never Used  . Alcohol Use: Yes     Comment: SELDOM    Family History  Problem Relation Age of Onset  . Diabetes Mother   . Hypertension Mother   . Heart disease Mother   . Fibromyalgia Mother   . Hypertension Father   . Hypertension Sister   . COPD Sister   . OCD Sister   . Fibromyalgia Sister   . Bipolar disorder Brother   . Bipolar disorder Maternal  Grandfather     Allergies  Allergen Reactions  . Aspirin Other (See Comments)    Sick on stomach  . Ibuprofen Other (See Comments)    Sick on the stomach    Medication list has been reviewed and updated.  Current Outpatient Prescriptions on File Prior to Visit  Medication Sig Dispense Refill  . bisoprolol-hydrochlorothiazide (ZIAC) 5-6.25 MG per tablet Take 1 tablet by mouth daily.      . calcium carbonate (OS-CAL) 600 MG TABS Take 600 mg by mouth 2 (two) times daily with a meal.      . celecoxib (CELEBREX) 200 MG capsule Take 200 mg by mouth 2 (two) times daily.      . cholecalciferol (VITAMIN D) 1000 UNITS tablet Take 1,000 Units by mouth daily.      . citalopram (CELEXA) 20 MG tablet Take 40 mg by mouth daily.       . cyclobenzaprine (FLEXERIL) 10 MG tablet Take 10 mg by mouth 3 (three) times daily as needed.      Marland Kitchen estradiol (ESTRACE) 1 MG tablet Take 1 tablet (1 mg total) by mouth daily.  90 tablet  4  .  levothyroxine (SYNTHROID, LEVOTHROID) 125 MCG tablet Take 125 mcg by mouth daily.      Marland Kitchen losartan (COZAAR) 50 MG tablet Take 50 mg by mouth daily.      . metFORMIN (GLUCOPHAGE) 500 MG tablet Take 500 mg by mouth 2 (two) times daily with a meal.      . Multiple Vitamin (MULTIVITAMIN) tablet Take 1 tablet by mouth daily.      . pantoprazole (PROTONIX) 40 MG tablet Take 40 mg by mouth daily.      . simvastatin (ZOCOR) 80 MG tablet Take 80 mg by mouth at bedtime.      . traMADol (ULTRAM) 50 MG tablet Take 50 mg by mouth every 6 (six) hours as needed.      . zolpidem (AMBIEN) 10 MG tablet Take 10 mg by mouth at bedtime as needed.        Review of Systems:  As per HPI, otherwise negative.    Physical Examination: Filed Vitals:   10/15/12 1047  BP: 169/95  Pulse: 76  Temp: 97.4 F (36.3 C)  Resp: 18   Filed Vitals:   10/15/12 1047  Height: 5\' 3"  (1.6 m)  Weight: 178 lb 12.8 oz (81.103 kg)   Body mass index is 31.67 kg/(m^2). Ideal Body Weight: Weight in (lb) to have  BMI = 25: 140.8   GEN: WDWN, NAD, Non-toxic, A & O x 3 HEENT: Atraumatic, Normocephalic. Neck supple. No masses, No LAD. Ears and Nose: No external deformity. CV: RRR, No M/G/R. No JVD. No thrill. No extra heart sounds. PULM: CTA B, no wheezes, crackles, rhonchi. No retractions. No resp. distress. No accessory muscle use. ABD: S, NT, ND, +BS. No rebound. No HSM. EXTR: No c/c/e NEURO Normal gait.  PSYCH: Normally interactive. Conversant. Not depressed or anxious appearing.  Calm demeanor.    Assessment and Plan: Bronchitis Possible pertussis Labs zithromax mucinex d tussionex Follow up as needed  Carmelina Dane, MD

## 2012-10-18 LAB — BORDETELLA PERTUSSIS ANTIBODY: B pertussis IgG Ab, Quant: 0.2 U/mL (ref 0.0–0.9)

## 2013-02-01 ENCOUNTER — Encounter: Payer: Self-pay | Admitting: Gynecology

## 2013-02-01 ENCOUNTER — Ambulatory Visit (INDEPENDENT_AMBULATORY_CARE_PROVIDER_SITE_OTHER): Payer: BC Managed Care – PPO | Admitting: Gynecology

## 2013-02-01 VITALS — BP 124/78 | Ht 63.0 in | Wt 179.0 lb

## 2013-02-01 DIAGNOSIS — Z01419 Encounter for gynecological examination (general) (routine) without abnormal findings: Secondary | ICD-10-CM

## 2013-02-01 DIAGNOSIS — R300 Dysuria: Secondary | ICD-10-CM

## 2013-02-01 DIAGNOSIS — R140 Abdominal distension (gaseous): Secondary | ICD-10-CM

## 2013-02-01 DIAGNOSIS — Z780 Asymptomatic menopausal state: Secondary | ICD-10-CM

## 2013-02-01 DIAGNOSIS — L292 Pruritus vulvae: Secondary | ICD-10-CM

## 2013-02-01 DIAGNOSIS — N899 Noninflammatory disorder of vagina, unspecified: Secondary | ICD-10-CM

## 2013-02-01 DIAGNOSIS — R143 Flatulence: Secondary | ICD-10-CM

## 2013-02-01 DIAGNOSIS — R141 Gas pain: Secondary | ICD-10-CM

## 2013-02-01 DIAGNOSIS — N898 Other specified noninflammatory disorders of vagina: Secondary | ICD-10-CM

## 2013-02-01 DIAGNOSIS — K589 Irritable bowel syndrome without diarrhea: Secondary | ICD-10-CM

## 2013-02-01 DIAGNOSIS — Z8639 Personal history of other endocrine, nutritional and metabolic disease: Secondary | ICD-10-CM

## 2013-02-01 DIAGNOSIS — L293 Anogenital pruritus, unspecified: Secondary | ICD-10-CM

## 2013-02-01 LAB — URINALYSIS W MICROSCOPIC + REFLEX CULTURE
Ketones, ur: NEGATIVE mg/dL
Leukocytes, UA: NEGATIVE
Nitrite: NEGATIVE
Specific Gravity, Urine: <1.005 — ABNORMAL LOW (ref 1.005–1.030)
Urobilinogen, UA: 0.2 mg/dL (ref 0.0–1.0)
pH: 6 (ref 5.0–8.0)

## 2013-02-01 LAB — WET PREP FOR TRICH, YEAST, CLUE
Trich, Wet Prep: NONE SEEN
Yeast Wet Prep HPF POC: NONE SEEN

## 2013-02-01 MED ORDER — FLUCONAZOLE 100 MG PO TABS: 100.0000 mg | ORAL_TABLET | Freq: Every day | ORAL | Status: DC

## 2013-02-01 MED ORDER — ESTRADIOL 1 MG PO TABS: 1.0000 mg | ORAL_TABLET | Freq: Every day | ORAL | Status: DC

## 2013-02-01 MED ORDER — TERCONAZOLE 0.4 % VA CREA: 1.0000 | TOPICAL_CREAM | Freq: Every day | VAGINAL | Status: DC

## 2013-02-01 NOTE — Patient Instructions (Signed)
Candidal Vulvovaginitis Candidal vulvovaginitis is an infection of the vagina and vulva. The vulva is the skin around the opening of the vagina. This may cause itching and discomfort in and around the vagina.  HOME CARE  Only take medicine as told by your doctor.  Do not have sex (intercourse) until the infection is healed or as told by your doctor.  Practice safe sex.  Tell your sex partner about your infection.  Do not douche or use tampons.  Wear cotton underwear. Do not wear tight pants or panty hose.  Eat yogurt. This may help treat and prevent yeast infections. GET HELP RIGHT AWAY IF:   You have a fever.  Your problems get worse during treatment or do not get better in 3 days.  You have discomfort, irritation, or itching in your vagina or vulva area.  You have pain after sex.  You start to get belly (abdominal) pain. MAKE SURE YOU:  Understand these instructions.  Will watch your condition.  Will get help right away if you are not doing well or get worse. Document Released: 12/03/2008 Document Revised: 11/29/2011 Document Reviewed: 12/03/2008 Exit[employer] Patient Information 2013 [employer].  

## 2013-02-01 NOTE — Progress Notes (Addendum)
Carrie Macias 08-17-1956 [id number]   History:    57 y.o.  for annual gyn exam complaining of some abdominal bloating. Patient does have history of IBS. She was also having some vaginal irritation  Along with a vulvar pruritus. Patient does have history vitamin D deficiency. Patient with prior history of laparoscopic-assisted vaginal hysterectomy with right salpingo-oophorectomy and sling procedure and anterior colporrhaphy in the past. She is here for her annual gynecological examination as well. In 2011 patient had her colonoscopy. Patient is currently on Estrace 1 mg by mouth daily for vasomotor symptoms. Her last mammogram was in 2013. Dr. Tiburcio Pea her primary physician has been doing her lab work.  Past medical history,surgical history, family history and social history were all reviewed and documented in the EPIC chart.  Gynecologic History No LMP recorded. Patient has had a hysterectomy. Contraception: status post hysterectomy Last Pap: 2012. Results were: normal Last mammogram: 2013. Results were: normal  Obstetric History OB History   Grav Para Term Preterm Abortions TAB SAB Ect Mult Living   3 2 2  1  1   2      # Outc Date GA Lbr Len/2nd Wgt Sex Del Anes PTL Lv   1 TRM     M SVD  No Yes   2 SAB            3 TRM     F SVD  No Yes       ROS: A ROS was performed and pertinent positives and negatives are included in the history.  GENERAL: No fevers or chills. HEENT: No change in vision, no earache, sore throat or sinus congestion. NECK: No pain or stiffness. CARDIOVASCULAR: No chest pain or pressure. No palpitations. PULMONARY: No shortness of breath, cough or wheeze. GASTROINTESTINAL: No abdominal pain, nausea, vomiting or diarrhea, melena or bright red blood per rectum. GENITOURINARY: No urinary frequency, urgency, hesitancy or dysuria. MUSCULOSKELETAL: No joint or muscle pain, no back pain, no recent trauma. DERMATOLOGIC: No rash, no itching, no lesions. ENDOCRINE: No polyuria,  polydipsia, no heat or cold intolerance. No recent change in weight. HEMATOLOGICAL: No anemia or easy bruising or bleeding. NEUROLOGIC: No headache, seizures, numbness, tingling or weakness. PSYCHIATRIC: No depression, no loss of interest in normal activity or change in sleep pattern.     Exam: chaperone present  BP 124/78  Ht 5\' 3"  (1.6 m)  Wt 179 lb (81.194 kg)  BMI 31.72 kg/m2  Body mass index is 31.72 kg/(m^2).  General appearance : Well developed well nourished female. No acute distress HEENT: Neck supple, trachea midline, no carotid bruits, no thyroidmegaly Lungs: Clear to auscultation, no rhonchi or wheezes, or rib retractions  Heart: Regular rate and rhythm, no murmurs or gallops Breast:Examined in sitting and supine position were symmetrical in appearance, no palpable masses or tenderness,  no skin retraction, no nipple inversion, no nipple discharge, no skin discoloration, no axillary or supraclavicular lymphadenopathy Abdomen: no palpable masses or tenderness, no rebound or guarding Extremities: no edema or skin discoloration or tenderness  Pelvic:  Bartholin, Urethra, Skene Glands: Within normal limits             Vagina: No gross lesions or discharge  Cervix: absent  Uterus  Absent,  Adnexa  Without masses or tenderness  Anus and perineum  normal   Rectovaginal  normal sphincter tone without palpated masses or tenderness             Hemoccult cards provided   Wet prep: Many  bacteria few WBC.  Assessment/Plan:  58 y.o. female for annual exam with full vulvar pruritus it appears that she may have partially treated herself with over-the-counter Monistat. I'm going to give her a trial of Diflucan 100 mg one by mouth today and may repeat in 2-3 days. Hemoccult cards were provided for her to submit to the office for testing. We are going to check her vitamin D level today. Her bone density study is due next year. She will schedule her mammogram in the next few months.  Prescription refill for the Estrace was provided. We discussed importance of calcium vitamin D for osteoporosis prevention along with regular exercise.patient will return back to the office in one to 2 weeks for a pelvic ultrasound to better assess her remaining ovary because of her sensation of bloating and also since it was difficult to completely evaluate because of her abdominal girth.   Ok Edwards MD, 1:33 PM 02/01/2013

## 2013-02-02 LAB — VITAMIN D 25 HYDROXY (VIT D DEFICIENCY, FRACTURES): Vit D, 25-Hydroxy: 43 ng/mL (ref 30–89)

## 2013-02-05 ENCOUNTER — Encounter: Payer: Self-pay | Admitting: Gynecology

## 2013-02-09 ENCOUNTER — Encounter: Payer: Self-pay | Admitting: Gynecology

## 2013-02-26 ENCOUNTER — Ambulatory Visit (INDEPENDENT_AMBULATORY_CARE_PROVIDER_SITE_OTHER): Payer: BC Managed Care – PPO

## 2013-02-26 ENCOUNTER — Ambulatory Visit (INDEPENDENT_AMBULATORY_CARE_PROVIDER_SITE_OTHER): Payer: BC Managed Care – PPO | Admitting: Gynecology

## 2013-02-26 ENCOUNTER — Encounter: Payer: Self-pay | Admitting: Gynecology

## 2013-02-26 VITALS — BP 146/90

## 2013-02-26 DIAGNOSIS — N83339 Acquired atrophy of ovary and fallopian tube, unspecified side: Secondary | ICD-10-CM

## 2013-02-26 DIAGNOSIS — R141 Gas pain: Secondary | ICD-10-CM

## 2013-02-26 DIAGNOSIS — R140 Abdominal distension (gaseous): Secondary | ICD-10-CM

## 2013-02-26 DIAGNOSIS — R1031 Right lower quadrant pain: Secondary | ICD-10-CM

## 2013-02-26 DIAGNOSIS — R142 Eructation: Secondary | ICD-10-CM

## 2013-02-26 NOTE — Progress Notes (Signed)
Patient presented to the office today for an ultrasound. She was seen in the office on May 15 for annual exam had been complaining of abdominal bloating. Patient does have a history of IBS. Due to patient's weight and abdominal girth for complete evaluation she was asking for an ultrasound today. vaginal irritation Along with a vulvar pruritus. Patient does have history vitamin D deficiency. Patient with prior history of laparoscopic-assisted vaginal hysterectomy with right salpingo-oophorectomy and sling procedure and anterior colporrhaphy in the past.  The patient was asymptomatic today. Ultrasound today demonstrated left ovary atrophic with normal echo pattern. Absent uterus and right tube and ovary.  Assessment/plan: Patient with sporadic abdominal bloating probably attributed to her IBS. The patient had colonoscopy 3 years ago which was normal. She was given Hemoccult cards to smoke to the office for testing. If her symptoms continue she will follow up with gastroenterologist. GYN standpoint will see her back next year or when necessary. She is scheduled for a mammogram sometime this year.

## 2013-04-10 ENCOUNTER — Ambulatory Visit
Admission: RE | Admit: 2013-04-10 | Discharge: 2013-04-10 | Disposition: A | Discharge status: Home / Self Care | Payer: BC Managed Care – PPO | Source: Ambulatory Visit | Attending: Family Medicine | Admitting: Family Medicine

## 2013-04-10 ENCOUNTER — Other Ambulatory Visit: Payer: Self-pay | Admitting: Family Medicine

## 2013-04-10 DIAGNOSIS — J40 Bronchitis, not specified as acute or chronic: Secondary | ICD-10-CM

## 2013-04-30 ENCOUNTER — Other Ambulatory Visit: Payer: Self-pay | Admitting: Gynecology

## 2013-05-20 ENCOUNTER — Ambulatory Visit (INDEPENDENT_AMBULATORY_CARE_PROVIDER_SITE_OTHER): Payer: BC Managed Care – PPO | Admitting: Family Medicine

## 2013-05-20 VITALS — BP 122/74 | HR 77 | Temp 98.0°F | Resp 17 | Ht 64.0 in | Wt 178.0 lb

## 2013-05-20 DIAGNOSIS — L259 Unspecified contact dermatitis, unspecified cause: Secondary | ICD-10-CM

## 2013-05-20 DIAGNOSIS — L239 Allergic contact dermatitis, unspecified cause: Secondary | ICD-10-CM

## 2013-05-20 MED ORDER — METHYLPREDNISOLONE ACETATE 80 MG/ML IJ SUSP
80.0000 mg | Freq: Once | INTRAMUSCULAR | Status: AC
  Administered 2013-05-20: 80 mg via INTRAMUSCULAR

## 2013-05-20 NOTE — Progress Notes (Signed)
Is a 57 year old single princes widowed) woman who works at [employer]. She's developed a rash which is quite itchy around her neck over the last 72 hours. He began after she took a trip to that and her dogs her scratching her.  Social history: Patient is widowed x3 years after her husband died of a heart attack, her daughter and son-in-law were living in the house but her daughter died of an acute pulmonary illness last winter and she's been cleaning out the room where her daughter lived.  Objective: Diffuse erythematous rash which is confluent and involves the entire anterior two thirds of her neck from just lower chin down to her blouse line.  Assessment: Contact dermatitis  Plan: Depo-Medrol shot Allergic dermatitis  Signed, Elvina Sidle, MD

## 2013-08-15 ENCOUNTER — Other Ambulatory Visit: Payer: Self-pay

## 2013-08-15 DIAGNOSIS — Z1231 Encounter for screening mammogram for malignant neoplasm of breast: Secondary | ICD-10-CM

## 2013-09-25 ENCOUNTER — Ambulatory Visit
Admission: RE | Admit: 2013-09-25 | Discharge: 2013-09-25 | Disposition: A | Discharge status: Home / Self Care | Payer: BC Managed Care – PPO | Source: Ambulatory Visit

## 2013-09-25 DIAGNOSIS — Z1231 Encounter for screening mammogram for malignant neoplasm of breast: Secondary | ICD-10-CM

## 2014-02-06 ENCOUNTER — Other Ambulatory Visit: Payer: Self-pay | Admitting: Gynecology

## 2014-02-07 ENCOUNTER — Encounter: Payer: Self-pay | Admitting: Gynecology

## 2014-02-07 ENCOUNTER — Ambulatory Visit (INDEPENDENT_AMBULATORY_CARE_PROVIDER_SITE_OTHER): Payer: BC Managed Care – PPO | Admitting: Gynecology

## 2014-02-07 VITALS — BP 128/86 | Ht 62.5 in | Wt 180.0 lb

## 2014-02-07 DIAGNOSIS — Z8639 Personal history of other endocrine, nutritional and metabolic disease: Secondary | ICD-10-CM

## 2014-02-07 DIAGNOSIS — N8111 Cystocele, midline: Secondary | ICD-10-CM

## 2014-02-07 DIAGNOSIS — M949 Disorder of cartilage, unspecified: Secondary | ICD-10-CM

## 2014-02-07 DIAGNOSIS — M8580 Other specified disorders of bone density and structure, unspecified site: Secondary | ICD-10-CM

## 2014-02-07 DIAGNOSIS — N8110 Cystocele, unspecified: Secondary | ICD-10-CM | POA: Insufficient documentation

## 2014-02-07 DIAGNOSIS — B373 Candidiasis of vulva and vagina: Secondary | ICD-10-CM

## 2014-02-07 DIAGNOSIS — B3731 Acute candidiasis of vulva and vagina: Secondary | ICD-10-CM

## 2014-02-07 DIAGNOSIS — Z01419 Encounter for gynecological examination (general) (routine) without abnormal findings: Secondary | ICD-10-CM

## 2014-02-07 DIAGNOSIS — N898 Other specified noninflammatory disorders of vagina: Secondary | ICD-10-CM

## 2014-02-07 DIAGNOSIS — M899 Disorder of bone, unspecified: Secondary | ICD-10-CM

## 2014-02-07 LAB — WET PREP FOR TRICH, YEAST, CLUE
Clue Cells Wet Prep HPF POC: NONE SEEN
Trich, Wet Prep: NONE SEEN

## 2014-02-07 MED ORDER — FLUCONAZOLE 150 MG PO TABS: 150.0000 mg | ORAL_TABLET | Freq: Once | ORAL | Status: DC

## 2014-02-07 MED ORDER — ESTRADIOL 1 MG PO TABS: 1.0000 mg | ORAL_TABLET | Freq: Every day | ORAL | Status: DC

## 2014-02-07 NOTE — Progress Notes (Signed)
Carrie Macias 1955/11/24 [id number]   History:    58 y.o.  presented to the office for her annual gynecological exam. Patient was complaining of vaginal pruritus. Patient has been doing well otherwise asked to vasomotor symptoms since she has been on Estrace 1 mg by mouth daily.Patient with prior history of laparoscopic-assisted vaginal hysterectomy with right salpingo-oophorectomy and sling procedure and anterior colporrhaphy in the past.Dr. Kenton Kingfisher her primary physician has been doing her lab work.   Patient's last bone density study was in 2013 with the lowest T. score at the right femoral neck with a value of -1.5 and normal FRAX analysis. She is overdue for mammogram last one was in 2013. Patient with past history vitamin D deficiency. She stated that her last colonoscopy was normal. Last year she was seen in the office complaining of feeling bloated had a normal ultrasound of her remaining left atrophic ovary. She does suffer from IBS. She was doing well today. Patient with no prior history of abnormal Pap smears.   Past medical history,surgical history, family history and social history were all reviewed and documented in the EPIC chart.  Gynecologic History No LMP recorded. Patient has had a hysterectomy. Contraception: status post hysterectomy Last Pap: 2013. Results were: normal Last mammogram: 2013. Results were: normal  Obstetric History OB History  Gravida Para Term Preterm AB SAB TAB Ectopic Multiple Living  3 2 2  1 1    2     # Outcome Date GA Lbr Len/2nd Weight Sex Delivery Anes PTL Lv  3 SAB           2 TRM     M SVD  N Y  1 TRM     F SVD  N Y       ROS: A ROS was performed and pertinent positives and negatives are included in the history.  GENERAL: No fevers or chills. HEENT: No change in vision, no earache, sore throat or sinus congestion. NECK: No pain or stiffness. CARDIOVASCULAR: No chest pain or pressure. No palpitations. PULMONARY: No shortness of breath,  cough or wheeze. GASTROINTESTINAL: No abdominal pain, nausea, vomiting or diarrhea, melena or bright red blood per rectum. GENITOURINARY: No urinary frequency, urgency, hesitancy or dysuria. MUSCULOSKELETAL: No joint or muscle pain, no back pain, no recent trauma. DERMATOLOGIC: Vaginal itching ENDOCRINE: No polyuria, polydipsia, no heat or cold intolerance. No recent change in weight. HEMATOLOGICAL: No anemia or easy bruising or bleeding. NEUROLOGIC: No headache, seizures, numbness, tingling or weakness. PSYCHIATRIC: No depression, no loss of interest in normal activity or change in sleep pattern.     Exam: chaperone present  BP 128/86  Ht 5' 2.5" (1.588 m)  Wt 180 lb (81.647 kg)  BMI 32.38 kg/m2  Body mass index is 32.38 kg/(m^2).  General appearance : Well developed well nourished female. No acute distress HEENT: Neck supple, trachea midline, no carotid bruits, no thyroidmegaly Lungs: Clear to auscultation, no rhonchi or wheezes, or rib retractions  Heart: Regular rate and rhythm, no murmurs or gallops Breast:Examined in sitting and supine position were symmetrical in appearance, no palpable masses or tenderness,  no skin retraction, no nipple inversion, no nipple discharge, no skin discoloration, no axillary or supraclavicular lymphadenopathy Abdomen: no palpable masses or tenderness, no rebound or guarding Extremities: no edema or skin discoloration or tenderness  Pelvic:  Bartholin, Urethra, Skene Glands: Within normal limits             Vagina: Thick white discharge  Cervix:  Absent  Uterus  Absent  Adnexa  Without masses or tenderness  Anus and perineum  normal   Rectovaginal  normal sphincter tone without palpated masses or tenderness             Hemoccult cards provided   Wet prep: Moderate yeast  Assessment/Plan:  57 y.o. female for annual exam with clinical evidence of yeast vaginitis we treated with Diflucan 150 mg one by mouth. Since patient has had a history of vitamin D  deficiency in the past we will check her vitamin D level today her PCP will be drawn and the rest of her blood work. We discussed the importance of calcium vitamin D and regular exercise for osteoporosis prevention. She was schedule her bone density study next few weeks. She will also schedule her mammogram which is overdue. She was reminded to submit to the office the thecal Hemoccult cards for testing. Pap smear not done today in accordance to the new guidelines.   Note: This dictation was prepared with  Dragon/digital dictation along withSmart phrase technology. Any transcriptional errors that result from this process are unintentional.   Terrance Mass MD, 11:40 AM 02/07/2014

## 2014-02-07 NOTE — Patient Instructions (Addendum)
Bone Densitometry Bone densitometry is a special X-ray that measures your bone density and can be used to help predict your risk of bone fractures. This test is used to determine bone mineral content and density to diagnose osteoporosis. Osteoporosis is the loss of bone that may cause the bone to become weak. Osteoporosis commonly occurs in women entering menopause. However, it may be found in men and in people with other diseases. PREPARATION FOR TEST No preparation necessary. WHO SHOULD BE TESTED?  All women older than 57.  Postmenopausal women (50 to 54) with risk factors for osteoporosis.  People with a previous fracture caused by normal activities.  People with a small body frame (less than 127 poundsor a body mass index [BMI] of less than 21).  People who have a parent with a hip fracture or history of osteoporosis.  People who smoke.  People who have rheumatoid arthritis.  Anyone who engages in excessive alcohol use (more than 3 drinks most days).  Women who experience early menopause. WHEN SHOULD YOU BE RETESTED? Current guidelines suggest that you should wait at least 2 years before doing a bone density test again if your first test was normal.Recent studies indicated that women with normal bone density may be able to wait a few years before needing to repeat a bone density test. You should discuss this with your caregiver.  NORMAL FINDINGS   Normal: less than standard deviation below normal (greater than -1).  Osteopenia: 1 to 2.5 standard deviations below normal (-1 to -2.5).  Osteoporosis: greater than 2.5 standard deviations below normal (less than -2.5). Test results are reported as a "T score" and a "Z score."The T score is a number that compares your bone density with the bone density of healthy, young women.The Z score is a number that compares your bone density with the scores of women who are the same age, gender, and race.  Ranges for normal findings may vary  among different laboratories and hospitals. You should always check with your doctor after having lab work or other tests done to discuss the meaning of your test results and whether your values are considered within normal limits. MEANING OF TEST  Your caregiver will go over the test results with you and discuss the importance and meaning of your results, as well as treatment options and the need for additional tests if necessary. OBTAINING THE TEST RESULTS It is your responsibility to obtain your test results. Ask the lab or department performing the test when and how you will get your results. Document Released: 09/28/2004 Document Revised: 11/29/2011 Document Reviewed: 10/21/2010 [facility] Patient Information 2014 [place]. Monilial Vaginitis Vaginitis in a soreness, swelling and redness (inflammation) of the vagina and vulva. Monilial vaginitis is not a sexually transmitted infection. CAUSES  Yeast vaginitis is caused by yeast (candida) that is normally found in your vagina. With a yeast infection, the candida has overgrown in number to a point that upsets the chemical balance. SYMPTOMS   White, thick vaginal discharge.  Swelling, itching, redness and irritation of the vagina and possibly the lips of the vagina (vulva).  Burning or painful urination.  Painful intercourse. DIAGNOSIS  Things that may contribute to monilial vaginitis are:  Postmenopausal and virginal states.  Pregnancy.  Infections.  Being tired, sick or stressed, especially if you had monilial vaginitis in the past.  Diabetes. Good control will help lower the chance.  Birth control pills.  Tight fitting garments.  Using bubble bath, feminine sprays, douches or deodorant  tampons.  Taking certain medications that kill germs (antibiotics).  Sporadic recurrence can occur if you become ill. TREATMENT  Your caregiver will give you medication.  There are several kinds of anti monilial vaginal creams and  suppositories specific for monilial vaginitis. For recurrent yeast infections, use a suppository or cream in the vagina 2 times a week, or as directed.  Anti-monilial or steroid cream for the itching or irritation of the vulva may also be used. Get your caregiver's permission.  Painting the vagina with methylene blue solution may help if the monilial cream does not work.  Eating yogurt may help prevent monilial vaginitis. HOME CARE INSTRUCTIONS   Finish all medication as prescribed.  Do not have sex until treatment is completed or after your caregiver tells you it is okay.  Take warm sitz baths.  Do not douche.  Do not use tampons, especially scented ones.  Wear cotton underwear.  Avoid tight pants and panty hose.  Tell your sexual partner that you have a yeast infection. They should go to their caregiver if they have symptoms such as mild rash or itching.  Your sexual partner should be treated as well if your infection is difficult to eliminate.  Practice safer sex. Use condoms.  Some vaginal medications cause latex condoms to fail. Vaginal medications that harm condoms are:  Cleocin cream.  Butoconazole (Femstat).  Terconazole (Terazol) vaginal suppository.  Miconazole (Monistat) (may be purchased over the counter). SEEK MEDICAL CARE IF:   You have a temperature by mouth above 102 F (38.9 C).  The infection is getting worse after 2 days of treatment.  The infection is not getting better after 3 days of treatment.  You develop blisters in or around your vagina.  You develop vaginal bleeding, and it is not your menstrual period.  You have pain when you urinate.  You develop intestinal problems.  You have pain with sexual intercourse. Document Released: 06/16/2005 Document Revised: 11/29/2011 Document Reviewed: 02/28/2009 [facility] Patient Information 2014 [place], [place].

## 2014-02-07 NOTE — Addendum Note (Signed)
Addended by: Thurnell Garbe A on: 02/07/2014 02:01 PM   Modules accepted: Orders

## 2014-02-08 LAB — VITAMIN D 25 HYDROXY (VIT D DEFICIENCY, FRACTURES): VIT D 25 HYDROXY: 58 ng/mL (ref 30–89)

## 2014-03-21 ENCOUNTER — Ambulatory Visit (INDEPENDENT_AMBULATORY_CARE_PROVIDER_SITE_OTHER): Payer: BC Managed Care – PPO

## 2014-03-21 DIAGNOSIS — M949 Disorder of cartilage, unspecified: Secondary | ICD-10-CM

## 2014-03-21 DIAGNOSIS — Z8639 Personal history of other endocrine, nutritional and metabolic disease: Secondary | ICD-10-CM

## 2014-03-21 DIAGNOSIS — M899 Disorder of bone, unspecified: Secondary | ICD-10-CM

## 2014-03-21 DIAGNOSIS — M8580 Other specified disorders of bone density and structure, unspecified site: Secondary | ICD-10-CM

## 2014-07-22 ENCOUNTER — Encounter: Payer: Self-pay | Admitting: Gynecology

## 2014-08-23 ENCOUNTER — Other Ambulatory Visit: Payer: Self-pay

## 2014-08-23 DIAGNOSIS — Z1231 Encounter for screening mammogram for malignant neoplasm of breast: Secondary | ICD-10-CM

## 2014-09-26 ENCOUNTER — Ambulatory Visit
Admission: RE | Admit: 2014-09-26 | Discharge: 2014-09-26 | Disposition: A | Discharge status: Home / Self Care | Payer: BLUE CROSS/BLUE SHIELD | Source: Ambulatory Visit

## 2014-09-26 DIAGNOSIS — Z1231 Encounter for screening mammogram for malignant neoplasm of breast: Secondary | ICD-10-CM

## 2014-11-23 ENCOUNTER — Encounter (HOSPITAL_COMMUNITY): Payer: Self-pay | Admitting: Physical Medicine and Rehabilitation

## 2014-11-23 ENCOUNTER — Emergency Department (HOSPITAL_COMMUNITY): Payer: BLUE CROSS/BLUE SHIELD

## 2014-11-23 ENCOUNTER — Emergency Department (HOSPITAL_COMMUNITY)
Admission: EM | Admit: 2014-11-23 | Discharge: 2014-11-23 | Disposition: A | Discharge status: Home / Self Care | Payer: BLUE CROSS/BLUE SHIELD | Attending: Emergency Medicine | Admitting: Emergency Medicine

## 2014-11-23 DIAGNOSIS — K219 Gastro-esophageal reflux disease without esophagitis: Secondary | ICD-10-CM | POA: Insufficient documentation

## 2014-11-23 DIAGNOSIS — I10 Essential (primary) hypertension: Secondary | ICD-10-CM | POA: Diagnosis not present

## 2014-11-23 DIAGNOSIS — F419 Anxiety disorder, unspecified: Secondary | ICD-10-CM | POA: Insufficient documentation

## 2014-11-23 DIAGNOSIS — J45909 Unspecified asthma, uncomplicated: Secondary | ICD-10-CM | POA: Insufficient documentation

## 2014-11-23 DIAGNOSIS — R059 Cough, unspecified: Secondary | ICD-10-CM

## 2014-11-23 DIAGNOSIS — G8929 Other chronic pain: Secondary | ICD-10-CM | POA: Diagnosis not present

## 2014-11-23 DIAGNOSIS — R0981 Nasal congestion: Secondary | ICD-10-CM | POA: Diagnosis present

## 2014-11-23 DIAGNOSIS — R011 Cardiac murmur, unspecified: Secondary | ICD-10-CM | POA: Diagnosis not present

## 2014-11-23 DIAGNOSIS — Z85850 Personal history of malignant neoplasm of thyroid: Secondary | ICD-10-CM | POA: Insufficient documentation

## 2014-11-23 DIAGNOSIS — F329 Major depressive disorder, single episode, unspecified: Secondary | ICD-10-CM | POA: Diagnosis not present

## 2014-11-23 DIAGNOSIS — Z87448 Personal history of other diseases of urinary system: Secondary | ICD-10-CM | POA: Insufficient documentation

## 2014-11-23 DIAGNOSIS — Z791 Long term (current) use of non-steroidal anti-inflammatories (NSAID): Secondary | ICD-10-CM | POA: Insufficient documentation

## 2014-11-23 DIAGNOSIS — R05 Cough: Secondary | ICD-10-CM | POA: Insufficient documentation

## 2014-11-23 DIAGNOSIS — E079 Disorder of thyroid, unspecified: Secondary | ICD-10-CM | POA: Diagnosis not present

## 2014-11-23 DIAGNOSIS — E119 Type 2 diabetes mellitus without complications: Secondary | ICD-10-CM | POA: Diagnosis not present

## 2014-11-23 DIAGNOSIS — Z79899 Other long term (current) drug therapy: Secondary | ICD-10-CM | POA: Diagnosis not present

## 2014-11-23 NOTE — ED Provider Notes (Signed)
CSN: [id number]     Arrival date & time 11/23/14  0800 History   First MD Initiated Contact with Patient 11/23/14 [phone]     Chief Complaint  Patient presents with  . Nasal Congestion  . Cough     (Consider location/radiation/quality/duration/timing/severity/associated sxs/prior Treatment) Patient is a 59 y.o. female presenting with cough. The history is provided by the patient. No language interpreter was used.  Cough Cough characteristics:  Productive Sputum characteristics:  Green Severity:  Moderate Onset quality:  Gradual Timing:  Constant Progression:  Unchanged Chronicity:  New Smoker: no   Context: upper respiratory infection   Relieved by:  Nothing Ineffective treatments:  Beta-agonist inhaler Associated symptoms: fever and sinus congestion     Past Medical History  Diagnosis Date  . Hormone disorder     HYPOTHYROIDISM  . Hypertension   . Detrusor dyssynergia   . Anxiety   . Hypercholesterolemia   . Back pain, chronic   . Diabetes mellitus     TYPE II  . Cancer     THYROID  . Allergy   . Depression   . GERD (gastroesophageal reflux disease)   . Asthma   . Thyroid disease   . Heart murmur    Past Surgical History  Procedure Laterality Date  . Tonsillectomy and adenoidectomy    . Tubal ligation    . External ear surgery    . Abdominal hysterectomy      LAVH/RSO/SLING/ANT/COLPO  . Colonoscopy  07/16/2010  . Inner ear surgery      IMPLANT   Family History  Problem Relation Age of Onset  . Diabetes Mother   . Hypertension Mother   . Heart disease Mother   . Fibromyalgia Mother   . Hypertension Father   . Hypertension Sister   . COPD Sister   . OCD Sister   . Fibromyalgia Sister   . Bipolar disorder Brother   . Bipolar disorder Maternal Grandfather    History  Substance Use Topics  . Smoking status: Never Smoker   . Smokeless tobacco: Never Used  . Alcohol Use: Yes     Comment: SELDOM   OB History    Gravida Para Term Preterm AB TAB SAB  Ectopic Multiple Living   3 2 2  1  1   2      Review of Systems  Constitutional: Positive for fever.  Respiratory: Positive for cough.   All other systems reviewed and are negative.     Allergies  Aspirin and Ibuprofen  Home Medications   Prior to Admission medications   Medication Sig Start Date End Date Taking? Authorizing Provider  albuterol (PROVENTIL HFA;VENTOLIN HFA) 108 (90 BASE) MCG/ACT inhaler Inhale 2 puffs into the lungs every 6 (six) hours as needed for wheezing or shortness of breath.   Yes Historical Provider, MD  bisoprolol-hydrochlorothiazide (ZIAC) 5-6.25 MG per tablet Take 1 tablet by mouth daily.   Yes Historical Provider, MD  calcium carbonate (OS-CAL) 600 MG TABS Take 600 mg by mouth 2 (two) times daily with a meal.   Yes Historical Provider, MD  celecoxib (CELEBREX) 200 MG capsule Take 200 mg by mouth 2 (two) times daily.   Yes Historical Provider, MD  chlorpheniramine-HYDROcodone (TUSSIONEX PENNKINETIC ER) 10-8 MG/5ML LQCR Take 5 mLs by mouth every 12 (twelve) hours as needed (cough). 10/15/12  Yes Roselee Culver, MD  cholecalciferol (VITAMIN D) 1000 UNITS tablet Take 1,000 Units by mouth daily.   Yes Historical Provider, MD  citalopram (CELEXA) 20 MG  tablet Take 40 mg by mouth daily.    Yes Historical Provider, MD  cyclobenzaprine (FLEXERIL) 10 MG tablet Take 10 mg by mouth 3 (three) times daily as needed.   Yes Historical Provider, MD  esomeprazole (NEXIUM) 20 MG capsule Take 20 mg by mouth daily at 12 noon.   Yes Historical Provider, MD  estradiol (ESTRACE) 1 MG tablet Take 1 tablet (1 mg total) by mouth daily. 02/07/14  Yes Terrance Mass, MD  levofloxacin (LEVAQUIN) 500 MG tablet Take 500 mg by mouth daily. For 10 days. Started on 11-19-14   Yes Historical Provider, MD  levothyroxine (SYNTHROID, LEVOTHROID) 125 MCG tablet Take 125 mcg by mouth daily.   Yes Historical Provider, MD  losartan (COZAAR) 50 MG tablet Take 50 mg by mouth daily.   Yes Historical  Provider, MD  metFORMIN (GLUCOPHAGE-XR) 500 MG 24 hr tablet Take 500 mg by mouth daily with breakfast.   Yes Historical Provider, MD  Multiple Vitamin (MULTIVITAMIN) tablet Take 1 tablet by mouth daily.   Yes Historical Provider, MD  pantoprazole (PROTONIX) 40 MG tablet Take 40 mg by mouth daily.   Yes Historical Provider, MD  simvastatin (ZOCOR) 80 MG tablet Take 80 mg by mouth at bedtime.   Yes Historical Provider, MD  traMADol (ULTRAM) 50 MG tablet Take 50 mg by mouth every 6 (six) hours as needed.   Yes Historical Provider, MD  zolpidem (AMBIEN) 10 MG tablet Take 10 mg by mouth at bedtime as needed.   Yes Historical Provider, MD  fluconazole (DIFLUCAN) 150 MG tablet Take 1 tablet (150 mg total) by mouth once. 02/07/14   Terrance Mass, MD  metFORMIN (GLUCOPHAGE) 500 MG tablet Take 500 mg by mouth 2 (two) times daily with a meal.    Historical Provider, MD   BP 174/64 mmHg  Pulse 73  Temp(Src) 97.9 F (36.6 C) (Oral)  Resp 18  Ht 5\' 1"  (1.549 m)  Wt 175 lb (79.379 kg)  BMI 33.08 kg/m2  SpO2 98% Physical Exam  Constitutional: She is oriented to person, place, and time. She appears well-developed and well-nourished.  HENT:  Head: Normocephalic and atraumatic.  Cardiovascular: Normal rate and regular rhythm.   Pulmonary/Chest: No respiratory distress. She has rales.  Musculoskeletal: Normal range of motion.  Neurological: She is alert and oriented to person, place, and time.  Skin: Skin is warm and dry.  Psychiatric: She has a normal mood and affect.  Nursing note and vitals reviewed.   ED Course  Procedures (including critical care time) Labs Review Labs Reviewed - No data to display  Imaging Review Dg Chest 2 View  11/23/2014   CLINICAL DATA:  Productive cough.  Fever.  EXAM: CHEST  2 VIEW  COMPARISON:  04/10/2013  FINDINGS: Stable cardiac and mediastinal contours with mild tortuosity of the thoracic aorta. No consolidative pulmonary opacities. No pleural effusion or  pneumothorax. Mid thoracic spine degenerative change.  IMPRESSION: No acute cardiopulmonary process.   Electronically Signed   By: Lovey Newcomer M.D.   On: 11/23/2014 09:57     EKG Interpretation None      MDM   Final diagnoses:  Cough    Pt is already on antibiotics and cough syrup. No pneumonia noted on xray    Glendell Docker, NP 11/23/14 1002  Shaune Pollack, MD 11/23/14 1520

## 2014-11-23 NOTE — Discharge Instructions (Signed)
Cough, Adult   A cough is a reflex. It helps you clear your throat and airways. A cough can help heal your body. A cough can last 2 or 3 weeks (acute) or may last more than 8 weeks (chronic). Some common causes of a cough can include an infection, allergy, or a cold.  HOME CARE  · Only take medicine as told by your doctor.  · If given, take your medicines (antibiotics) as told. Finish them even if you start to feel better.  · Use a cold steam vaporizer or humidifier in your home. This can help loosen thick spit (secretions).  · Sleep so you are almost sitting up (semi-upright). Use pillows to do this. This helps reduce coughing.  · Rest as needed.  · Stop smoking if you smoke.  GET HELP RIGHT AWAY IF:  · You have yellowish-white fluid (pus) in your thick spit.  · Your cough gets worse.  · Your medicine does not reduce coughing, and you are losing sleep.  · You cough up blood.  · You have trouble breathing.  · Your pain gets worse and medicine does not help.  · You have a fever.  MAKE SURE YOU:   · Understand these instructions.  · Will watch your condition.  · Will get help right away if you are not doing well or get worse.  Document Released: 05/20/2011 Document Revised: 01/21/2014 Document Reviewed: 05/20/2011  [employer]® Patient Information ©2015 [employer]. This information is not intended to replace advice given to you by your health care provider. Make sure you discuss any questions you have with your health care provider.

## 2014-11-23 NOTE — ED Notes (Signed)
Pt presents to department for evaluation of nasal congestion, productive cough and fatigue. Currently taking antibiotics prescribed by PCP, pt states no relief of symptoms. Respirations unlabored. No signs of distress noted.

## 2015-02-21 ENCOUNTER — Other Ambulatory Visit: Payer: Self-pay | Admitting: Gynecology

## 2015-03-06 ENCOUNTER — Ambulatory Visit (INDEPENDENT_AMBULATORY_CARE_PROVIDER_SITE_OTHER): Payer: BLUE CROSS/BLUE SHIELD | Admitting: Gynecology

## 2015-03-06 ENCOUNTER — Encounter: Payer: Self-pay | Admitting: Gynecology

## 2015-03-06 VITALS — BP 124/84 | Ht 63.25 in | Wt 163.0 lb

## 2015-03-06 DIAGNOSIS — Z8639 Personal history of other endocrine, nutritional and metabolic disease: Secondary | ICD-10-CM | POA: Diagnosis not present

## 2015-03-06 DIAGNOSIS — N8110 Cystocele, unspecified: Secondary | ICD-10-CM | POA: Diagnosis not present

## 2015-03-06 DIAGNOSIS — J208 Acute bronchitis due to other specified organisms: Secondary | ICD-10-CM

## 2015-03-06 DIAGNOSIS — IMO0002 Reserved for concepts with insufficient information to code with codable children: Secondary | ICD-10-CM

## 2015-03-06 DIAGNOSIS — Z01419 Encounter for gynecological examination (general) (routine) without abnormal findings: Secondary | ICD-10-CM | POA: Diagnosis not present

## 2015-03-06 MED ORDER — AZITHROMYCIN 250 MG PO TABS: ORAL_TABLET | ORAL | Status: DC

## 2015-03-06 MED ORDER — DM-GUAIFENESIN ER 30-600 MG PO TB12: 1.0000 | ORAL_TABLET | Freq: Two times a day (BID) | ORAL | Status: DC

## 2015-03-06 NOTE — Patient Instructions (Signed)
Azithromycin for infusion What is this medicine? AZITHROMYCIN (az ith roe MYE sin) is a macrolide antibiotic. It is used to treat or prevent certain kinds of bacterial infections. It will not work for colds, flu, or other viral infections. This medicine may be used for other purposes; ask your health care provider or pharmacist if you have questions. COMMON BRAND NAME(S): Zithromax What should I tell my health care provider before I take this medicine? They need to know if you have any of these conditions: -kidney disease -liver disease -irregular heartbeat or heart disease -an unusual or allergic reaction to azithromycin, erythromycin, other macrolide antibiotics, foods, dyes, or preservatives -pregnant or trying to get pregnant -breast-feeding How should I use this medicine? This medicine is for infusion into a vein. It is given by a health care professional in a hospital or clinic or may be given during home health care. Take your medicine at regular intervals. Do not take your medicine more often than directed. Take all of your medicine as directed even if you think you are better. Do not skip doses or stop your medicine early. Talk to your pediatrician regarding the use of this medicine in children. Special care may be needed. Overdosage: If you think you have taken too much of this medicine contact a poison control center or emergency room at once. NOTE: This medicine is only for you. Do not share this medicine with others. What if I miss a dose? This does not apply. What may interact with this medicine? Do not take this medicine with any of the following medications: -lincomycin This medicine may also interact with the following medications: -amiodarone -birth control pills -cyclosporine -digoxin -nelfinavir -phenytoin -warfarin This list may not describe all possible interactions. Give your health care provider a list of all the medicines, herbs, non-prescription drugs, or  dietary supplements you use. Also tell them if you smoke, drink alcohol, or use illegal drugs. Some items may interact with your medicine. What should I watch for while using this medicine? Your condition will be monitored closely. Tell your doctor or health care professional if your symptoms do not improve. What side effects may I notice from receiving this medicine? Side effects that you should report to your doctor or health care professional as soon as possible: -allergic reactions like skin rash, itching or hives, swelling of the face, lips, or tongue -anxious, hyperactive, nervous -dark urine -difficulty breathing -hearing loss -irregular heartbeat or chest pain -pain or difficulty passing urine -redness, blistering, peeling or loosening of the skin, including inside the mouth -white patches or sores in the mouth -yellowing of eyes, skin Side effects that usually do not require medical attention (report to your doctor or health care professional if they continue or are bothersome): -diarrhea -dizziness, drowsiness -headache -loss of appetite, bad taste -pain, swelling at the site of injection -stomach upset, vomiting -vaginal irritation This list may not describe all possible side effects. Call your doctor for medical advice about side effects. You may report side effects to FDA at 1-800-FDA-1088. Where should I keep my medicine? Keep out of the reach of children. This drug is usually given in a hospital or clinic and will usually not be stored at home. You will be instructed on how to store this medicine, if needed. Throw away any unused medicine after the expiration date on the label. NOTE: This sheet is a summary. It may not cover all possible information. If you have questions about this medicine, talk to your doctor, pharmacist,  or health care provider.  2015, Elsevier/Gold Standard. (2013-04-12 15:34:55) Acute Bronchitis Bronchitis is inflammation of the airways that extend  from the windpipe into the lungs (bronchi). The inflammation often causes mucus to develop. This leads to a cough, which is the most common symptom of bronchitis.  In acute bronchitis, the condition usually develops suddenly and goes away over time, usually in a couple weeks. Smoking, allergies, and asthma can make bronchitis worse. Repeated episodes of bronchitis may cause further lung problems.  CAUSES Acute bronchitis is most often caused by the same virus that causes a cold. The virus can spread from person to person (contagious) through coughing, sneezing, and touching contaminated objects. SIGNS AND SYMPTOMS   Cough.   Fever.   Coughing up mucus.   Body aches.   Chest congestion.   Chills.   Shortness of breath.   Sore throat.  DIAGNOSIS  Acute bronchitis is usually diagnosed through a physical exam. Your health care provider will also ask you questions about your medical history. Tests, such as chest X-rays, are sometimes done to rule out other conditions.  TREATMENT  Acute bronchitis usually goes away in a couple weeks. Oftentimes, no medical treatment is necessary. Medicines are sometimes given for relief of fever or cough. Antibiotic medicines are usually not needed but may be prescribed in certain situations. In some cases, an inhaler may be recommended to help reduce shortness of breath and control the cough. A cool mist vaporizer may also be used to help thin bronchial secretions and make it easier to clear the chest.  HOME CARE INSTRUCTIONS  Get plenty of rest.   Drink enough fluids to keep your urine clear or pale yellow (unless you have a medical condition that requires fluid restriction). Increasing fluids may help thin your respiratory secretions (sputum) and reduce chest congestion, and it will prevent dehydration.   Take medicines only as directed by your health care provider.  If you were prescribed an antibiotic medicine, finish it all even if you  start to feel better.  Avoid smoking and secondhand smoke. Exposure to cigarette smoke or irritating chemicals will make bronchitis worse. If you are a smoker, consider using nicotine gum or skin patches to help control withdrawal symptoms. Quitting smoking will help your lungs heal faster.   Reduce the chances of another bout of acute bronchitis by washing your hands frequently, avoiding people with cold symptoms, and trying not to touch your hands to your mouth, nose, or eyes.   Keep all follow-up visits as directed by your health care provider.  SEEK MEDICAL CARE IF: Your symptoms do not improve after 1 week of treatment.  SEEK IMMEDIATE MEDICAL CARE IF:  You develop an increased fever or chills.   You have chest pain.   You have severe shortness of breath.  You have bloody sputum.   You develop dehydration.  You faint or repeatedly feel like you are going to pass out.  You develop repeated vomiting.  You develop a severe headache. MAKE SURE YOU:   Understand these instructions.  Will watch your condition.  Will get help right away if you are not doing well or get worse. Document Released: 10/14/2004 Document Revised: 01/21/2014 Document Reviewed: 02/27/2013 [facility] Patient Information 2015 [place], [place]. This information is not intended to replace advice given to you by your health care provider. Make sure you discuss any questions you have with your health care provider.

## 2015-03-06 NOTE — Progress Notes (Signed)
Carrie Macias [birth year] [id number]   History:    59 y.o.  for annual gyn exam who was complaining that for the past few days she's had a cough which sometimes productive. She has felt warm but has not taken her temperature. For patient's vasomotor symptoms has been taking Estrace 1 mg daily. Patient with past history of laparoscopic-assisted vaginal hysterectomy with right salpingo-oophorectomy along with sling procedure and anterior colporrhaphy done in the past. Her primary care physician is Dr. Kenton Kingfisher with been doing her blood work.  Her last bone density study was in 2015 the lowest T score was at the distal one third radius left forearm -1.5. Right femoral neck -1.4 with normal Frax. When compared with previous study of 2011 lowest statistically significant improvement of her AP spine a 14.3%, left femoral neck of 5.2% and the right femoral neck of 12.5%. She is taking her calcium and vitamin D.  Patient had a normal colonoscopy in 2011. Patient with no past history of abnormal Pap smears.   Past medical history,surgical history, family history and social history were all reviewed and documented in the EPIC chart.  Gynecologic History No LMP recorded. Patient has had a hysterectomy. Contraception: status post hysterectomy Last Pap: 2012. Results were: normal Last mammogram: 2016. Results were: normal  Obstetric History OB History  Gravida Para Term Preterm AB SAB TAB Ectopic Multiple Living  3 2 2  1 1    2     # Outcome Date GA Lbr Len/2nd Weight Sex Delivery Anes PTL Lv  3 Term     F Vag-Spont  N Y  2 SAB           1 Term     M Vag-Spont  N Y       ROS: A ROS was performed and pertinent positives and negatives are included in the history.  GENERAL: No fevers or chills. HEENT: No change in vision, no earache, sore throat or sinus congestion. NECK: No pain or stiffness. CARDIOVASCULAR: No chest pain or pressure. No palpitations. PULMONARY: Complaining of productive cough for  several days GASTROINTESTINAL: No abdominal pain, nausea, vomiting or diarrhea, melena or bright red blood per rectum. GENITOURINARY: No urinary frequency, urgency, hesitancy or dysuria. MUSCULOSKELETAL: No joint or muscle pain, no back pain, no recent trauma. DERMATOLOGIC: No rash, no itching, no lesions. ENDOCRINE: No polyuria, polydipsia, no heat or cold intolerance. No recent change in weight. HEMATOLOGICAL: No anemia or easy bruising or bleeding. NEUROLOGIC: No headache, seizures, numbness, tingling or weakness. PSYCHIATRIC: No depression, no loss of interest in normal activity or change in sleep pattern.     Exam: chaperone present  BP 124/84 mmHg  Ht 5' 3.25" (1.607 m)  Wt 163 lb (73.936 kg)  BMI 28.63 kg/m2  Body mass index is 28.63 kg/(m^2).  General appearance : Well developed well nourished female. No acute distress HEENT: Eyes: no retinal hemorrhage or exudates,  Neck supple, trachea midline, no carotid bruits, no thyroidmegaly Lungs: Respiratory rhonchi both upper lung fields no rales  Heart: Regular rate and rhythm, no murmurs or gallops Breast:Examined in sitting and supine position were symmetrical in appearance, no palpable masses or tenderness,  no skin retraction, no nipple inversion, no nipple discharge, no skin discoloration, no axillary or supraclavicular lymphadenopathy Abdomen: no palpable masses or tenderness, no rebound or guarding Extremities: no edema or skin discoloration or tenderness  Pelvic:  Bartholin, Urethra, Skene Glands: Within normal limits  Vagina: No gross lesions or discharge  Cervix: Absent  Uterus  absent  Adnexa  Without masses or tenderness  Anus and perineum  normal   Rectovaginal  normal sphincter tone without palpated masses or tenderness             Hemoccult PCP provides     Assessment/Plan:  59 y.o. female for annual exam with clinical evidence of acute bronchitis. She will be prescribed Zithromax 250 mg she is to take 2  tablets today and then 1 tablet daily for the next 4 days. He is to use her albuterol inhaler 2 puffs every 6 hours. I've called in for Mucinex 1 tablet 2 times a day as a mucolytic agent also. Because her history vitamin D deficiency vitamin D level be done today the remainder her blood work we don't by her primary care physician. Patient is reminded to do her monthly breast exams. We discussed importance of calcium vitamin D and regular exercise for osteoporosis prevention.   Terrance Mass MD, 11:59 AM 03/06/2015

## 2015-03-07 LAB — VITAMIN D 25 HYDROXY (VIT D DEFICIENCY, FRACTURES): VIT D 25 HYDROXY: 42 ng/mL (ref 30–100)

## 2015-05-24 ENCOUNTER — Other Ambulatory Visit: Payer: Self-pay | Admitting: Gynecology

## 2015-12-05 ENCOUNTER — Other Ambulatory Visit: Payer: Self-pay

## 2015-12-05 DIAGNOSIS — Z1231 Encounter for screening mammogram for malignant neoplasm of breast: Secondary | ICD-10-CM

## 2015-12-23 ENCOUNTER — Ambulatory Visit
Admission: RE | Admit: 2015-12-23 | Discharge: 2015-12-23 | Disposition: A | Discharge status: Home / Self Care | Payer: BLUE CROSS/BLUE SHIELD | Source: Ambulatory Visit

## 2015-12-23 DIAGNOSIS — Z1231 Encounter for screening mammogram for malignant neoplasm of breast: Secondary | ICD-10-CM

## 2015-12-24 ENCOUNTER — Other Ambulatory Visit: Payer: Self-pay | Admitting: Gynecology

## 2015-12-24 DIAGNOSIS — R928 Other abnormal and inconclusive findings on diagnostic imaging of breast: Secondary | ICD-10-CM

## 2015-12-31 ENCOUNTER — Ambulatory Visit
Admission: RE | Admit: 2015-12-31 | Discharge: 2015-12-31 | Disposition: A | Discharge status: Home / Self Care | Payer: BLUE CROSS/BLUE SHIELD | Source: Ambulatory Visit | Attending: Gynecology | Admitting: Gynecology

## 2015-12-31 DIAGNOSIS — R928 Other abnormal and inconclusive findings on diagnostic imaging of breast: Secondary | ICD-10-CM

## 2016-01-03 DIAGNOSIS — J209 Acute bronchitis, unspecified: Secondary | ICD-10-CM | POA: Diagnosis not present

## 2016-01-03 DIAGNOSIS — J0100 Acute maxillary sinusitis, unspecified: Secondary | ICD-10-CM | POA: Diagnosis not present

## 2016-01-09 DIAGNOSIS — R05 Cough: Secondary | ICD-10-CM | POA: Diagnosis not present

## 2016-01-29 ENCOUNTER — Other Ambulatory Visit: Payer: Self-pay | Admitting: Family Medicine

## 2016-01-29 ENCOUNTER — Ambulatory Visit
Admission: RE | Admit: 2016-01-29 | Discharge: 2016-01-29 | Disposition: A | Discharge status: Home / Self Care | Payer: BLUE CROSS/BLUE SHIELD | Source: Ambulatory Visit | Attending: Family Medicine | Admitting: Family Medicine

## 2016-01-29 DIAGNOSIS — E7800 Pure hypercholesterolemia, unspecified: Secondary | ICD-10-CM | POA: Diagnosis not present

## 2016-01-29 DIAGNOSIS — Z1159 Encounter for screening for other viral diseases: Secondary | ICD-10-CM | POA: Diagnosis not present

## 2016-01-29 DIAGNOSIS — M7989 Other specified soft tissue disorders: Secondary | ICD-10-CM | POA: Diagnosis not present

## 2016-01-29 DIAGNOSIS — E119 Type 2 diabetes mellitus without complications: Secondary | ICD-10-CM | POA: Diagnosis not present

## 2016-01-29 DIAGNOSIS — E039 Hypothyroidism, unspecified: Secondary | ICD-10-CM | POA: Diagnosis not present

## 2016-01-29 DIAGNOSIS — Z0000 Encounter for general adult medical examination without abnormal findings: Secondary | ICD-10-CM | POA: Diagnosis not present

## 2016-01-29 DIAGNOSIS — R52 Pain, unspecified: Secondary | ICD-10-CM

## 2016-01-29 DIAGNOSIS — M25511 Pain in right shoulder: Secondary | ICD-10-CM | POA: Diagnosis not present

## 2016-01-29 DIAGNOSIS — I10 Essential (primary) hypertension: Secondary | ICD-10-CM | POA: Diagnosis not present

## 2016-01-29 DIAGNOSIS — Z7984 Long term (current) use of oral hypoglycemic drugs: Secondary | ICD-10-CM | POA: Diagnosis not present

## 2016-02-11 ENCOUNTER — Telehealth: Payer: Self-pay | Admitting: *Deleted

## 2016-02-11 MED ORDER — FLUCONAZOLE 150 MG PO TABS
150.0000 mg | ORAL_TABLET | Freq: Once | ORAL | Status: DC
Start: 2016-02-11 — End: 2016-06-30

## 2016-02-11 NOTE — Telephone Encounter (Signed)
Left message pt to call.  °

## 2016-02-11 NOTE — Telephone Encounter (Signed)
Pt called c/o yeast infection itching and white discharge, pt has been on antibiotic prescribed by another provider, pt would like diflucan tablet.

## 2016-02-11 NOTE — Telephone Encounter (Signed)
Call in Diflucan 150 mg PO. Make sure she does not take her Zocor (cholesterol medicine) the day she takes the Diflucan and the following 3 days

## 2016-02-11 NOTE — Telephone Encounter (Signed)
Pt informed with the below note, Rx sent. 

## 2016-03-03 ENCOUNTER — Other Ambulatory Visit: Payer: Self-pay | Admitting: Gastroenterology

## 2016-03-03 DIAGNOSIS — K8020 Calculus of gallbladder without cholecystitis without obstruction: Secondary | ICD-10-CM

## 2016-03-03 DIAGNOSIS — R1013 Epigastric pain: Secondary | ICD-10-CM | POA: Diagnosis not present

## 2016-03-03 DIAGNOSIS — K210 Gastro-esophageal reflux disease with esophagitis: Secondary | ICD-10-CM | POA: Diagnosis not present

## 2016-03-03 DIAGNOSIS — K219 Gastro-esophageal reflux disease without esophagitis: Secondary | ICD-10-CM | POA: Diagnosis not present

## 2016-03-03 DIAGNOSIS — R1011 Right upper quadrant pain: Secondary | ICD-10-CM | POA: Diagnosis not present

## 2016-03-10 ENCOUNTER — Encounter: Payer: Self-pay | Admitting: Gynecology

## 2016-03-10 ENCOUNTER — Ambulatory Visit (INDEPENDENT_AMBULATORY_CARE_PROVIDER_SITE_OTHER): Payer: BLUE CROSS/BLUE SHIELD | Admitting: Gynecology

## 2016-03-10 VITALS — BP 138/80 | Ht 63.0 in | Wt 165.0 lb

## 2016-03-10 DIAGNOSIS — M8580 Other specified disorders of bone density and structure, unspecified site: Secondary | ICD-10-CM

## 2016-03-10 DIAGNOSIS — R300 Dysuria: Secondary | ICD-10-CM | POA: Diagnosis not present

## 2016-03-10 DIAGNOSIS — Z01419 Encounter for gynecological examination (general) (routine) without abnormal findings: Secondary | ICD-10-CM

## 2016-03-10 DIAGNOSIS — Z8639 Personal history of other endocrine, nutritional and metabolic disease: Secondary | ICD-10-CM

## 2016-03-10 DIAGNOSIS — R350 Frequency of micturition: Secondary | ICD-10-CM

## 2016-03-10 DIAGNOSIS — IMO0001 Reserved for inherently not codable concepts without codable children: Secondary | ICD-10-CM

## 2016-03-10 LAB — URINALYSIS W MICROSCOPIC + REFLEX CULTURE
Bacteria, UA: NONE SEEN [HPF]
Bilirubin Urine: NEGATIVE
Casts: NONE SEEN [LPF]
Crystals: NONE SEEN [HPF]
Glucose, UA: NEGATIVE
Hgb urine dipstick: NEGATIVE
Ketones, ur: NEGATIVE
Leukocytes, UA: NEGATIVE
Nitrite: NEGATIVE
PH: 7 (ref 5.0–8.0)
Protein, ur: NEGATIVE
RBC / HPF: NONE SEEN RBC/HPF (ref <=2)
Specific Gravity, Urine: 1.01 (ref 1.001–1.035)
WBC UA: NONE SEEN WBC/HPF (ref <=5)
YEAST: NONE SEEN [HPF]

## 2016-03-10 NOTE — Progress Notes (Signed)
Carrie Macias 10-13-1955 [id number]   History:    60 y.o.  for annual gyn exam with complaining of frequency and dysuria. No fever, chills, nausea, or vomiting, patient with prior history of laparoscopic-assisted vaginal hysterectomy and right salpingo-oophorectomy and anterior colporrhaphy. Patient with no urinary incontinence. Her PCP has been Dr. Kenton Kingfisher who is been doing her blood work. She is currently being worked up for right upper quadrant pain for possible cholelithiasis and has an MRI of the abdomen scheduled for tomorrow. She had a normal colonoscopy in 2011.  Her last bone density study was in 2015 the lowest T score was at the distal one third radius left forearm -1.5. Right femoral neck -1.4 with normal Frax. When compared with previous study of 2011 lowest statistically significant improvement of her AP spine a 14.3%, left femoral neck of 5.2% and the right femoral neck of 12.5%. She is taking her calcium and vitamin D. Patient with no past history of abnormal Pap smears before after hysterectomy. Patient is taking Estrace 1 mg by mouth daily for vasomotor symptoms.  Past medical history,surgical history, family history and social history were all reviewed and documented in the EPIC chart.  Gynecologic History No LMP recorded. Patient has had a hysterectomy. Contraception: status post hysterectomy Last Pap: Several years ago. Results were: normal Last mammogram: 2017. Results were: normal  Obstetric History OB History  Gravida Para Term Preterm AB SAB TAB Ectopic Multiple Living  3 2 2  1 1    2     # Outcome Date GA Lbr Len/2nd Weight Sex Delivery Anes PTL Lv  3 Term     F Vag-Spont  N Y  2 SAB           1 Term     M Vag-Spont  N Y       ROS: A ROS was performed and pertinent positives and negatives are included in the history.  GENERAL: No fevers or chills. HEENT: No change in vision, no earache, sore throat or sinus congestion. NECK: No pain or stiffness.  CARDIOVASCULAR: No chest pain or pressure. No palpitations. PULMONARY: No shortness of breath, cough or wheeze. GASTROINTESTINAL: No abdominal pain, nausea, vomiting or diarrhea, melena or bright red blood per rectum. GENITOURINARY: No urinary frequency, urgency, hesitancy or dysuria. MUSCULOSKELETAL: No joint or muscle pain, no back pain, no recent trauma. DERMATOLOGIC: No rash, no itching, no lesions. ENDOCRINE: No polyuria, polydipsia, no heat or cold intolerance. No recent change in weight. HEMATOLOGICAL: No anemia or easy bruising or bleeding. NEUROLOGIC: No headache, seizures, numbness, tingling or weakness. PSYCHIATRIC: No depression, no loss of interest in normal activity or change in sleep pattern.     Exam: chaperone present  BP 138/80 mmHg  Ht 5\' 3"  (1.6 m)  Wt 165 lb (74.844 kg)  BMI 29.24 kg/m2  Body mass index is 29.24 kg/(m^2).  General appearance : Well developed well nourished female. No acute distress HEENT: Eyes: no retinal hemorrhage or exudates,  Neck supple, trachea midline, no carotid bruits, no thyroidmegaly Lungs: Clear to auscultation, no rhonchi or wheezes, or rib retractions  Heart: Regular rate and rhythm, no murmurs or gallops Breast:Examined in sitting and supine position were symmetrical in appearance, no palpable masses or tenderness,  no skin retraction, no nipple inversion, no nipple discharge, no skin discoloration, no axillary or supraclavicular lymphadenopathy Abdomen: no palpable masses or tenderness, no rebound or guarding Extremities: no edema or skin discoloration or tenderness  Pelvic:  Bartholin, Urethra,  Skene Glands: Within normal limits             Vagina: No gross lesions or discharge  Cervix: Absent  Uterus absent  Adnexa  Without masses or tenderness  Anus and perineum  normal   Rectovaginal  normal sphincter tone without palpated masses or tenderness             Hemoccult cards provided     Assessment/Plan:  60 y.o. female for annual  exam with dysuria and frequency her urinalysis was negative today was submitted for culture. H and does not need a Pap smear today according to new guidelines. Patient to schedule a bone density study here in 2015. Her PCP has been doing her blood work. I provided also with fecal Hemoccult cards dismissed to the office for testing.   Terrance Mass MD, 12:39 PM 03/10/2016

## 2016-03-11 LAB — URINE CULTURE

## 2016-03-12 ENCOUNTER — Ambulatory Visit
Admission: RE | Admit: 2016-03-12 | Discharge: 2016-03-12 | Disposition: A | Discharge status: Home / Self Care | Payer: BLUE CROSS/BLUE SHIELD | Source: Ambulatory Visit | Attending: Gastroenterology | Admitting: Gastroenterology

## 2016-03-12 DIAGNOSIS — K8020 Calculus of gallbladder without cholecystitis without obstruction: Secondary | ICD-10-CM

## 2016-03-12 DIAGNOSIS — R1011 Right upper quadrant pain: Secondary | ICD-10-CM | POA: Diagnosis not present

## 2016-03-17 ENCOUNTER — Other Ambulatory Visit: Payer: Self-pay | Admitting: Gastroenterology

## 2016-03-17 DIAGNOSIS — K2100 Gastro-esophageal reflux disease with esophagitis, without bleeding: Secondary | ICD-10-CM

## 2016-03-22 DIAGNOSIS — H903 Sensorineural hearing loss, bilateral: Secondary | ICD-10-CM | POA: Diagnosis not present

## 2016-03-22 DIAGNOSIS — E039 Hypothyroidism, unspecified: Secondary | ICD-10-CM | POA: Diagnosis not present

## 2016-03-22 DIAGNOSIS — H6123 Impacted cerumen, bilateral: Secondary | ICD-10-CM | POA: Diagnosis not present

## 2016-03-22 DIAGNOSIS — H6983 Other specified disorders of Eustachian tube, bilateral: Secondary | ICD-10-CM | POA: Diagnosis not present

## 2016-04-08 DIAGNOSIS — H6521 Chronic serous otitis media, right ear: Secondary | ICD-10-CM | POA: Diagnosis not present

## 2016-04-08 DIAGNOSIS — H6123 Impacted cerumen, bilateral: Secondary | ICD-10-CM | POA: Diagnosis not present

## 2016-04-08 DIAGNOSIS — H6983 Other specified disorders of Eustachian tube, bilateral: Secondary | ICD-10-CM | POA: Diagnosis not present

## 2016-04-08 DIAGNOSIS — H903 Sensorineural hearing loss, bilateral: Secondary | ICD-10-CM | POA: Diagnosis not present

## 2016-05-26 ENCOUNTER — Other Ambulatory Visit: Payer: Self-pay | Admitting: Gynecology

## 2016-05-26 DIAGNOSIS — H6521 Chronic serous otitis media, right ear: Secondary | ICD-10-CM | POA: Diagnosis not present

## 2016-05-26 DIAGNOSIS — H6123 Impacted cerumen, bilateral: Secondary | ICD-10-CM | POA: Diagnosis not present

## 2016-05-26 DIAGNOSIS — H6983 Other specified disorders of Eustachian tube, bilateral: Secondary | ICD-10-CM | POA: Diagnosis not present

## 2016-05-26 DIAGNOSIS — H903 Sensorineural hearing loss, bilateral: Secondary | ICD-10-CM | POA: Diagnosis not present

## 2016-06-30 ENCOUNTER — Telehealth: Payer: Self-pay | Admitting: *Deleted

## 2016-06-30 DIAGNOSIS — R12 Heartburn: Secondary | ICD-10-CM | POA: Diagnosis not present

## 2016-06-30 MED ORDER — FLUCONAZOLE 150 MG PO TABS: 150.0000 mg | ORAL_TABLET | Freq: Once | ORAL | 0 refills | Status: AC

## 2016-06-30 NOTE — Telephone Encounter (Signed)
Call in prescription for Diflucan 150 mg one tablet

## 2016-06-30 NOTE — Telephone Encounter (Signed)
Pt called requesting Rx for yeast infection itching and white discharge. Pt aware OV best,  Please advise

## 2016-06-30 NOTE — Telephone Encounter (Signed)
Left on voicemail Rx sent 

## 2016-08-02 DIAGNOSIS — E039 Hypothyroidism, unspecified: Secondary | ICD-10-CM | POA: Diagnosis not present

## 2016-08-02 DIAGNOSIS — R300 Dysuria: Secondary | ICD-10-CM | POA: Diagnosis not present

## 2016-08-02 DIAGNOSIS — I10 Essential (primary) hypertension: Secondary | ICD-10-CM | POA: Diagnosis not present

## 2016-08-02 DIAGNOSIS — E119 Type 2 diabetes mellitus without complications: Secondary | ICD-10-CM | POA: Diagnosis not present

## 2016-09-20 IMAGING — CR DG CLAVICLE*R*
2 series · 2 of 2 positions shown · non-contrast
Comparison: None

CLINICAL DATA: RIGHT clavicular pain and swelling in her
cancer

EXAM:
RIGHT CLAVICLE - 2+ VIEWS

[w clavicle ap right]
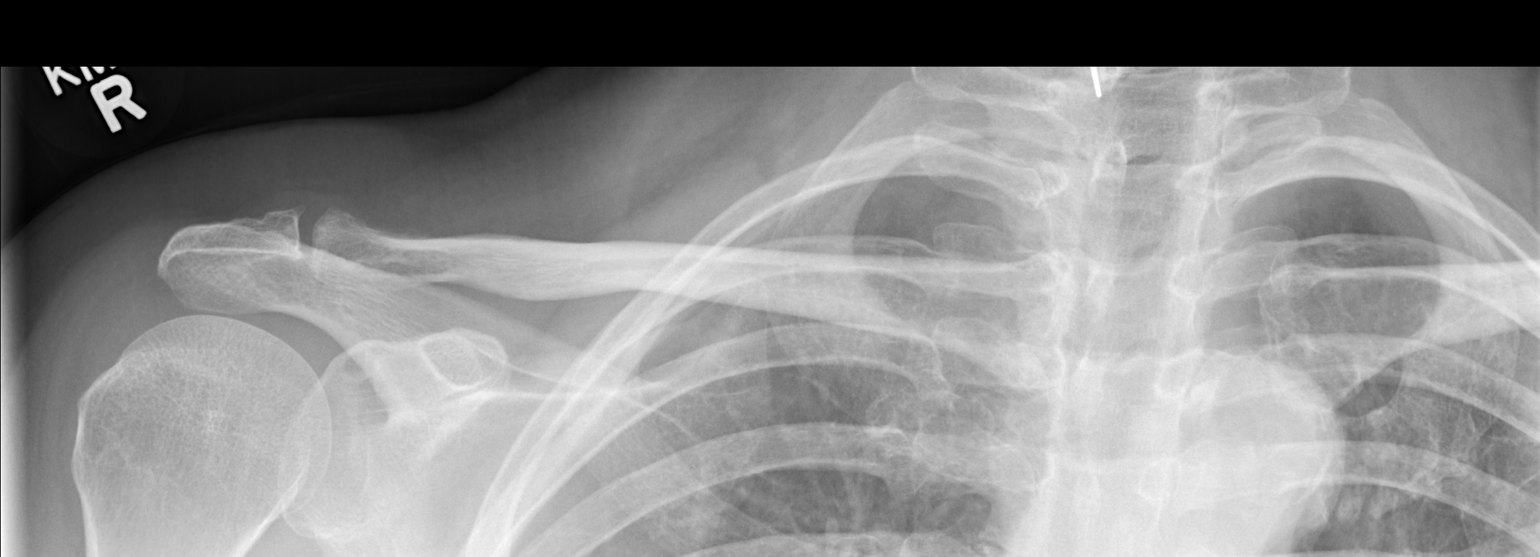

[w clavicle tangential right]
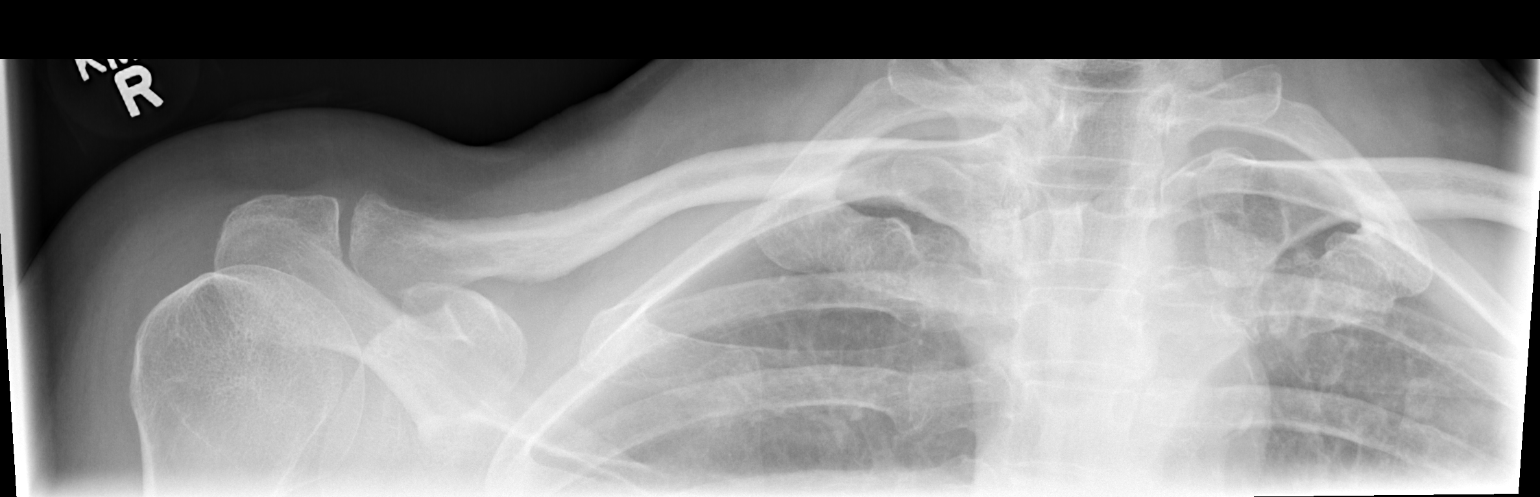

[2 of 2 positions shown; findings below may reference images not displayed]

FINDINGS: Normal sternoclavicular and acromioclavicular joint alignments
normal.

Osseous mineralization normal.

No acute fracture, dislocation or bone destruction.

Visualized ribs unremarkable.

Atherosclerotic calcifications aorta.

Surgical clips in cervical region from prior thyroidectomy.
IMPRESSION: No acute RIGHT clavicular abnormalities.

## 2016-10-01 DIAGNOSIS — R531 Weakness: Secondary | ICD-10-CM | POA: Diagnosis not present

## 2016-10-01 DIAGNOSIS — Z1211 Encounter for screening for malignant neoplasm of colon: Secondary | ICD-10-CM | POA: Diagnosis not present

## 2016-10-01 DIAGNOSIS — R51 Headache: Secondary | ICD-10-CM | POA: Diagnosis not present

## 2016-10-01 DIAGNOSIS — R634 Abnormal weight loss: Secondary | ICD-10-CM | POA: Diagnosis not present

## 2016-10-12 ENCOUNTER — Other Ambulatory Visit: Payer: Self-pay | Admitting: Family Medicine

## 2016-10-12 DIAGNOSIS — R634 Abnormal weight loss: Secondary | ICD-10-CM

## 2016-10-14 ENCOUNTER — Other Ambulatory Visit: Payer: Self-pay | Admitting: Family Medicine

## 2016-10-14 DIAGNOSIS — R51 Headache: Principal | ICD-10-CM

## 2016-10-14 DIAGNOSIS — R519 Headache, unspecified: Secondary | ICD-10-CM

## 2016-10-19 ENCOUNTER — Inpatient Hospital Stay
Admission: RE | Admit: 2016-10-19 | Discharge: 2016-10-19 | Disposition: A | Discharge status: Home / Self Care | Payer: BLUE CROSS/BLUE SHIELD | Source: Ambulatory Visit | Attending: Family Medicine | Admitting: Family Medicine

## 2016-10-21 ENCOUNTER — Ambulatory Visit
Admission: RE | Admit: 2016-10-21 | Discharge: 2016-10-21 | Disposition: A | Discharge status: Home / Self Care | Payer: BLUE CROSS/BLUE SHIELD | Source: Ambulatory Visit | Attending: Family Medicine | Admitting: Family Medicine

## 2016-10-21 DIAGNOSIS — R634 Abnormal weight loss: Secondary | ICD-10-CM | POA: Diagnosis not present

## 2016-10-21 MED ORDER — IOPAMIDOL (ISOVUE-300) INJECTION 61%
100.0000 mL | Freq: Once | INTRAVENOUS | Status: AC | PRN
  Administered 2016-10-21: 100 mL via INTRAVENOUS

## 2016-11-22 DIAGNOSIS — E039 Hypothyroidism, unspecified: Secondary | ICD-10-CM | POA: Diagnosis not present

## 2016-12-07 DIAGNOSIS — K219 Gastro-esophageal reflux disease without esophagitis: Secondary | ICD-10-CM | POA: Diagnosis not present

## 2016-12-12 ENCOUNTER — Emergency Department (HOSPITAL_COMMUNITY)
Admission: EM | Admit: 2016-12-12 | Discharge: 2016-12-14 | Disposition: A | Discharge status: Other Acute Inpatient Hospital | Payer: BLUE CROSS/BLUE SHIELD | Attending: Emergency Medicine | Admitting: Emergency Medicine

## 2016-12-12 ENCOUNTER — Encounter (HOSPITAL_COMMUNITY): Payer: Self-pay | Admitting: Nurse Practitioner

## 2016-12-12 DIAGNOSIS — Z79899 Other long term (current) drug therapy: Secondary | ICD-10-CM | POA: Diagnosis not present

## 2016-12-12 DIAGNOSIS — Z818 Family history of other mental and behavioral disorders: Secondary | ICD-10-CM | POA: Diagnosis not present

## 2016-12-12 DIAGNOSIS — Z85850 Personal history of malignant neoplasm of thyroid: Secondary | ICD-10-CM | POA: Insufficient documentation

## 2016-12-12 DIAGNOSIS — F329 Major depressive disorder, single episode, unspecified: Secondary | ICD-10-CM | POA: Diagnosis not present

## 2016-12-12 DIAGNOSIS — E119 Type 2 diabetes mellitus without complications: Secondary | ICD-10-CM | POA: Diagnosis not present

## 2016-12-12 DIAGNOSIS — F322 Major depressive disorder, single episode, severe without psychotic features: Secondary | ICD-10-CM

## 2016-12-12 DIAGNOSIS — R45851 Suicidal ideations: Secondary | ICD-10-CM

## 2016-12-12 DIAGNOSIS — E039 Hypothyroidism, unspecified: Secondary | ICD-10-CM | POA: Diagnosis not present

## 2016-12-12 DIAGNOSIS — J45909 Unspecified asthma, uncomplicated: Secondary | ICD-10-CM | POA: Insufficient documentation

## 2016-12-12 DIAGNOSIS — R109 Unspecified abdominal pain: Secondary | ICD-10-CM | POA: Diagnosis not present

## 2016-12-12 DIAGNOSIS — F32A Depression, unspecified: Secondary | ICD-10-CM

## 2016-12-12 LAB — COMPREHENSIVE METABOLIC PANEL
ALBUMIN: 4.7 g/dL (ref 3.5–5.0)
ALT: 33 U/L (ref 14–54)
ANION GAP: 8 (ref 5–15)
AST: 28 U/L (ref 15–41)
Alkaline Phosphatase: 60 U/L (ref 38–126)
BUN: 12 mg/dL (ref 6–20)
CHLORIDE: 107 mmol/L (ref 101–111)
CO2: 26 mmol/L (ref 22–32)
Calcium: 10 mg/dL (ref 8.9–10.3)
Creatinine, Ser: 0.53 mg/dL (ref 0.44–1.00)
GFR calc Af Amer: >60 mL/min (ref >60)
Glucose, Bld: 144 mg/dL — ABNORMAL HIGH (ref 65–99)
POTASSIUM: 3.9 mmol/L (ref 3.5–5.1)
Sodium: 141 mmol/L (ref 135–145)
Total Bilirubin: 0.8 mg/dL (ref 0.3–1.2)
Total Protein: 7.5 g/dL (ref 6.5–8.1)

## 2016-12-12 LAB — CBC
HCT: 43.1 % (ref 36.0–46.0)
Hemoglobin: 14.9 g/dL (ref 12.0–15.0)
MCH: 31 pg (ref 26.0–34.0)
MCHC: 34.6 g/dL (ref 30.0–36.0)
MCV: 89.6 fL (ref 78.0–100.0)
PLATELETS: 232 10*3/uL (ref 150–400)
RBC: 4.81 MIL/uL (ref 3.87–5.11)
RDW: 12.1 % (ref 11.5–15.5)
WBC: 7.2 10*3/uL (ref 4.0–10.5)

## 2016-12-12 LAB — ETHANOL

## 2016-12-12 LAB — RAPID URINE DRUG SCREEN, HOSP PERFORMED
Amphetamines: NOT DETECTED
Barbiturates: NOT DETECTED
Benzodiazepines: NOT DETECTED
Cocaine: NOT DETECTED
OPIATES: NOT DETECTED
Tetrahydrocannabinol: NOT DETECTED

## 2016-12-12 LAB — ACETAMINOPHEN LEVEL: Acetaminophen (Tylenol), Serum: <10 ug/mL — ABNORMAL LOW (ref 10–30)

## 2016-12-12 LAB — SALICYLATE LEVEL

## 2016-12-12 NOTE — ED Notes (Signed)
Bed: LN79 Expected date:  Expected time:  Means of arrival:  Comments: 47f self harm

## 2016-12-12 NOTE — ED Triage Notes (Signed)
Pt states she is "ready to die." States that over the last couple of months, she has lost appetite, not been able to eat, had severe acid reflux all contributing to loosing "muscle weight." Add that for the GI symptoms she has been evaluated by her doctor. States that 2 weeks ago, she took all her prescribed Ambien in a suicide attempt, yesterday morning she cut her left wrist in self harm and also took "some old thyroid pills" all for self harm. States she came in today to get help to "get back to her better self."

## 2016-12-12 NOTE — ED Provider Notes (Signed)
[place] DEPT Provider Note   CSN: [id number] Arrival date & time: 12/12/16  6222     History   Chief Complaint Chief Complaint  Patient presents with  . Suicidal  . Depression  . Abdominal Pain    HPI Carrie Macias is a 61 y.o. female.  She complains of being depressed for several months. She states that she had lost a lot of weight, and was worried about that. She had several suicide attempts in the recent past. Several weeks ago, she took an overdose of sleeping pills. Yesterday, she tried to cut her wrist. Several days ago, she took some thyroid pills. She states that now she wants to get some help. She does admit to crying spells, early morning wakening, and anhedonia. She has had some visual hallucinations where she saw her husband and her daughter who are both deceased, but denies auditory hallucinations. Last tetanus immunization was 5 years ago.   The history is provided by the patient.  Depression  Associated symptoms include abdominal pain.  Abdominal Pain      Past Medical History:  Diagnosis Date  . Allergy   . Anxiety   . Asthma   . Back pain, chronic   . Cancer (HCC)    THYROID  . Depression   . Detrusor dyssynergia   . Diabetes mellitus    TYPE II  . GERD (gastroesophageal reflux disease)   . Heart murmur   . Hormone disorder    HYPOTHYROIDISM  . Hypercholesterolemia   . Hypertension   . Thyroid disease     Patient Active Problem List   Diagnosis Date Noted  . Female cystocele 02/07/2014  . History of vitamin D deficiency 01/27/2012  . Hypercholesterolemia   . Back pain, chronic     Past Surgical History:  Procedure Laterality Date  . ABDOMINAL HYSTERECTOMY     LAVH/RSO/SLING/ANT/COLPO  . COLONOSCOPY  07/16/2010  . EXTERNAL EAR SURGERY    . INNER EAR SURGERY     IMPLANT  . TONSILLECTOMY AND ADENOIDECTOMY    . TUBAL LIGATION      OB History    Gravida Para Term Preterm AB Living   3 2 2   1 2    SAB TAB Ectopic Multiple  Live Births   1       2       Home Medications    Prior to Admission medications   Medication Sig Start Date End Date Taking? Authorizing Provider  albuterol (PROVENTIL HFA;VENTOLIN HFA) 108 (90 BASE) MCG/ACT inhaler Inhale 2 puffs into the lungs every 6 (six) hours as needed for wheezing or shortness of breath.    Historical Provider, MD  azithromycin (ZITHROMAX) 250 MG tablet Take two tablets first day then one every 24 hours for  4days Patient not taking: Reported on 03/10/2016 03/06/15   Terrance Mass, MD  bisoprolol-hydrochlorothiazide [facility]) 5-6.25 MG per tablet Take 1 tablet by mouth daily.    Historical Provider, MD  calcium carbonate (OS-CAL) 600 MG TABS Take 600 mg by mouth 2 (two) times daily with a meal.    Historical Provider, MD  celecoxib (CELEBREX) 200 MG capsule Take 200 mg by mouth 2 (two) times daily.    Historical Provider, MD  cholecalciferol (VITAMIN D) 1000 UNITS tablet Take 1,000 Units by mouth daily.    Historical Provider, MD  citalopram (CELEXA) 20 MG tablet Take 40 mg by mouth daily.     Historical Provider, MD  cyclobenzaprine (FLEXERIL) 10 MG tablet  Take 10 mg by mouth 3 (three) times daily as needed.    Historical Provider, MD  dextromethorphan-guaiFENesin (MUCINEX DM) 30-600 MG per 12 hr tablet Take 1 tablet by mouth 2 (two) times daily. 03/06/15   Terrance Mass, MD  esomeprazole (NEXIUM) 20 MG capsule Take 20 mg by mouth daily at 12 noon.    Historical Provider, MD  estradiol (ESTRACE) 1 MG tablet TAKE ONE TABLET BY MOUTH ONCE DAILY 05/26/16   Terrance Mass, MD  levothyroxine (SYNTHROID, LEVOTHROID) 125 MCG tablet Take 125 mcg by mouth daily.    Historical Provider, MD  losartan (COZAAR) 50 MG tablet Take 50 mg by mouth daily.    Historical Provider, MD  metFORMIN (GLUCOPHAGE) 500 MG tablet Take 500 mg by mouth 2 (two) times daily with a meal.    Historical Provider, MD  Multiple Vitamin (MULTIVITAMIN) tablet Take 1 tablet by mouth daily.    Historical  Provider, MD  simvastatin (ZOCOR) 80 MG tablet Take 80 mg by mouth at bedtime.    Historical Provider, MD  traMADol (ULTRAM) 50 MG tablet Take 50 mg by mouth every 6 (six) hours as needed.    Historical Provider, MD  zolpidem (AMBIEN) 10 MG tablet Take 10 mg by mouth at bedtime as needed.    Historical Provider, MD    Family History Family History  Problem Relation Age of Onset  . Diabetes Mother   . Hypertension Mother   . Heart disease Mother   . Fibromyalgia Mother   . Congestive Heart Failure Mother   . Hypertension Father   . Congestive Heart Failure Father   . Hypertension Sister   . COPD Sister   . OCD Sister   . Fibromyalgia Sister   . Bipolar disorder Sister   . Bipolar disorder Brother   . Bipolar disorder Sister   . Bipolar disorder Maternal Grandfather     Social History Social History  Substance Use Topics  . Smoking status: Never Smoker  . Smokeless tobacco: Never Used  . Alcohol use 0.0 oz/week     Comment: SELDOM     Allergies   Aspirin and Ibuprofen   Review of Systems Review of Systems  Gastrointestinal: Positive for abdominal pain.  Psychiatric/Behavioral: Positive for depression.  All other systems reviewed and are negative.    Physical Exam Updated Vital Signs BP 138/78 (BP Location: Left Arm)   Pulse (!) 116   Temp 98.4 F (36.9 C) (Oral)   SpO2 100%   Physical Exam  Nursing note and vitals reviewed.  61 year old female, resting comfortably and in no acute distress. Vital signs are Significant for tachycardia. Oxygen saturation is 100%, which is normal. Head is normocephalic and atraumatic. PERRLA, EOMI. Oropharynx is clear. Neck is nontender and supple without adenopathy or JVD. Back is nontender and there is no CVA tenderness. Lungs are clear without rales, wheezes, or rhonchi. Chest is nontender. Heart has regular rate and rhythm without murmur. Abdomen is soft, flat, nontender without masses or hepatosplenomegaly and  peristalsis is normoactive. Extremities have no cyanosis or edema, full range of motion is present. Small laceration on the flexor surface of the left wrist with surrounding erythema but no overt infection. Skin is warm and dry without rash. Neurologic: Mental status is normal, cranial nerves are intact, there are no motor or sensory deficits.  ED Treatments / Results  Labs (all labs ordered are listed, but only abnormal results are displayed) Labs Reviewed  COMPREHENSIVE METABOLIC PANEL -  Abnormal; Notable for the following:       Result Value   Glucose, Bld 144 (*)    All other components within normal limits  ACETAMINOPHEN LEVEL - Abnormal; Notable for the following:    Acetaminophen (Tylenol), Serum <10 (*)    All other components within normal limits  ETHANOL  SALICYLATE LEVEL  CBC  RAPID URINE DRUG SCREEN, HOSP PERFORMED   Procedures Procedures (including critical care time)  Medications Ordered in ED Medications - No data to display   Initial Impression / Assessment and Plan / ED Course  I have reviewed the triage vital signs and the nursing notes.  Pertinent labs & imaging results that were available during my care of the patient were reviewed by me and considered in my medical decision making (see chart for details).  Severe depression with several suicide attempts. Old records are reviewed, and I see no relevant past visits. Screening labs are obtained and TTS consultation will be obtained.  Screening labs are unremarkable. TTS consultation is pending.  Final Clinical Impressions(s) / ED Diagnoses   Final diagnoses:  Suicidal ideation  Severe single current episode of major depressive disorder, without psychotic features [facility])    New Prescriptions New Prescriptions   No medications on file     Delora Fuel, MD 00/93/81 [id number]

## 2016-12-13 DIAGNOSIS — R45851 Suicidal ideations: Secondary | ICD-10-CM

## 2016-12-13 DIAGNOSIS — J45909 Unspecified asthma, uncomplicated: Secondary | ICD-10-CM | POA: Diagnosis not present

## 2016-12-13 DIAGNOSIS — Z79899 Other long term (current) drug therapy: Secondary | ICD-10-CM

## 2016-12-13 DIAGNOSIS — Z818 Family history of other mental and behavioral disorders: Secondary | ICD-10-CM

## 2016-12-13 DIAGNOSIS — E039 Hypothyroidism, unspecified: Secondary | ICD-10-CM | POA: Diagnosis not present

## 2016-12-13 DIAGNOSIS — F329 Major depressive disorder, single episode, unspecified: Secondary | ICD-10-CM

## 2016-12-13 DIAGNOSIS — E119 Type 2 diabetes mellitus without complications: Secondary | ICD-10-CM | POA: Diagnosis not present

## 2016-12-13 DIAGNOSIS — F322 Major depressive disorder, single episode, severe without psychotic features: Secondary | ICD-10-CM | POA: Diagnosis not present

## 2016-12-13 DIAGNOSIS — F32A Depression, unspecified: Secondary | ICD-10-CM

## 2016-12-13 DIAGNOSIS — Z85850 Personal history of malignant neoplasm of thyroid: Secondary | ICD-10-CM | POA: Diagnosis not present

## 2016-12-13 LAB — URINALYSIS, ROUTINE W REFLEX MICROSCOPIC
Bilirubin Urine: NEGATIVE
Hgb urine dipstick: NEGATIVE
Ketones, ur: NEGATIVE mg/dL
LEUKOCYTES UA: NEGATIVE
Nitrite: NEGATIVE
Protein, ur: NEGATIVE mg/dL
Specific Gravity, Urine: 1.007 (ref 1.005–1.030)
pH: 7 (ref 5.0–8.0)

## 2016-12-13 LAB — TSH: TSH: 0.029 u[IU]/mL — ABNORMAL LOW (ref 0.350–4.500)

## 2016-12-13 MED ORDER — CALCIUM CARBONATE 1250 (500 CA) MG PO TABS
1250.0000 mg | ORAL_TABLET | Freq: Two times a day (BID) | ORAL | Status: DC
  Administered 2016-12-13 – 2016-12-14 (×3): 1250 mg via ORAL
  Filled 2016-12-13 (×4): qty 1

## 2016-12-13 MED ORDER — SODIUM CHLORIDE 0.9 % IV BOLUS (SEPSIS)
1000.0000 mL | Freq: Once | INTRAVENOUS | Status: AC
  Administered 2016-12-13: 1000 mL via INTRAVENOUS

## 2016-12-13 MED ORDER — METFORMIN HCL ER 500 MG PO TB24
500.0000 mg | ORAL_TABLET | Freq: Every day | ORAL | Status: DC
  Administered 2016-12-13 – 2016-12-14 (×2): 500 mg via ORAL
  Filled 2016-12-13 (×2): qty 1

## 2016-12-13 MED ORDER — BISOPROLOL-HYDROCHLOROTHIAZIDE 5-6.25 MG PO TABS
1.0000 | ORAL_TABLET | Freq: Every day | ORAL | Status: DC
  Administered 2016-12-13 – 2016-12-14 (×2): 1 via ORAL
  Filled 2016-12-13 (×2): qty 1

## 2016-12-13 MED ORDER — DOCUSATE SODIUM 100 MG PO CAPS
100.0000 mg | ORAL_CAPSULE | Freq: Every day | ORAL | Status: DC
  Administered 2016-12-13 – 2016-12-14 (×2): 100 mg via ORAL
  Filled 2016-12-13 (×2): qty 1

## 2016-12-13 MED ORDER — LORAZEPAM 1 MG PO TABS
1.0000 mg | ORAL_TABLET | Freq: Three times a day (TID) | ORAL | Status: DC | PRN
  Administered 2016-12-13: 1 mg via ORAL
  Filled 2016-12-13: qty 1

## 2016-12-13 MED ORDER — ADULT MULTIVITAMIN W/MINERALS CH
1.0000 | ORAL_TABLET | Freq: Every day | ORAL | Status: DC
  Administered 2016-12-13 – 2016-12-14 (×2): 1 via ORAL
  Filled 2016-12-13 (×2): qty 1

## 2016-12-13 MED ORDER — ATORVASTATIN CALCIUM 40 MG PO TABS
40.0000 mg | ORAL_TABLET | Freq: Every day | ORAL | Status: DC
  Administered 2016-12-13: 40 mg via ORAL
  Filled 2016-12-13: qty 1

## 2016-12-13 MED ORDER — ALUM & MAG HYDROXIDE-SIMETH 200-200-20 MG/5ML PO SUSP: 30.0000 mL | ORAL | Status: DC | PRN

## 2016-12-13 MED ORDER — VITAMIN C 250 MG PO TABS
250.0000 mg | ORAL_TABLET | Freq: Every day | ORAL | Status: DC
Start: 2016-12-13 — End: 2016-12-14
  Administered 2016-12-13 – 2016-12-14 (×2): 250 mg via ORAL
  Filled 2016-12-13 (×2): qty 1

## 2016-12-13 MED ORDER — VITAMIN D3 25 MCG (1000 UNIT) PO TABS
1000.0000 [IU] | ORAL_TABLET | Freq: Every day | ORAL | Status: DC
  Administered 2016-12-13 – 2016-12-14 (×2): 1000 [IU] via ORAL
  Filled 2016-12-13 (×2): qty 1

## 2016-12-13 MED ORDER — LEVOTHYROXINE SODIUM 125 MCG PO TABS
125.0000 ug | ORAL_TABLET | Freq: Every day | ORAL | Status: DC
  Administered 2016-12-14: 125 ug via ORAL
  Filled 2016-12-13 (×2): qty 1

## 2016-12-13 MED ORDER — ZOLPIDEM TARTRATE 5 MG PO TABS
5.0000 mg | ORAL_TABLET | Freq: Every evening | ORAL | Status: DC | PRN
  Administered 2016-12-13: 5 mg via ORAL
  Filled 2016-12-13: qty 1

## 2016-12-13 MED ORDER — ACETAMINOPHEN 325 MG PO TABS
650.0000 mg | ORAL_TABLET | ORAL | Status: DC | PRN
Start: 2016-12-13 — End: 2016-12-14

## 2016-12-13 MED ORDER — LOSARTAN POTASSIUM 50 MG PO TABS
100.0000 mg | ORAL_TABLET | Freq: Every day | ORAL | Status: DC
  Administered 2016-12-13 – 2016-12-14 (×2): 100 mg via ORAL
  Filled 2016-12-13 (×2): qty 2

## 2016-12-13 MED ORDER — ONDANSETRON HCL 4 MG PO TABS: 4.0000 mg | ORAL_TABLET | Freq: Three times a day (TID) | ORAL | Status: DC | PRN

## 2016-12-13 NOTE — ED Notes (Signed)
Dr. Florina Ou informed of pt's elevated HR.

## 2016-12-13 NOTE — ED Provider Notes (Signed)
Patient has been accepted at old [facility]. Can arrive tomorrow, Tuesday 3/27 after 7 AM. Accepting physician is Dr. Nelma Rothman, MD 12/13/16 [phone]

## 2016-12-13 NOTE — ED Notes (Signed)
Pt stated "I just don't have anybody anymore.  I lost my son to a car wreck, my daughter to pneumonia/flu and my husband to a heart attack.  Then I have all this GI stuff going on.  I'm just done."  Pt denies plan @ present time.

## 2016-12-13 NOTE — Progress Notes (Signed)
CSW received a call from [place] at [facility] that pt has been accepted.  Pt can arrive on 3/27 after 7am. Accepting Physician is: Dr. Kathlene Cote Pt will be in: [facility] Unit B # for report is: [phone]  Alphonse Guild. Devanta Daniel, Latanya Presser, LCAS Clinical Social Worker Ph: [phone]

## 2016-12-13 NOTE — BH Assessment (Addendum)
Tele Assessment Note   Carrie Macias is an 61 y.o. female, who presents voluntary and unaccompanied to [facility]. Pt reported, loosing over fifty pounds, due to a sodium sensitivity. Pt reported, eating a lot of processed foods however now she has to cook and feels her food is not good. Pt reported, the following stressors: taking care of her sister, cancer, the death of her 53 year old son in 74 in a car accident, her husband passing away in 2011, and the loss of her 73 year old daughter in 2012 from pneumonia. Pt reported, "I fell like I'm dying, no way out but to do this." Pt reported, "this" was committing suicide. Pt reported, she attempted suicide yesterday morning by cutting her wrist. Pt reported, attempting suicide "a couple of times." Pt reported, experiencing symptoms of depression: "feeling hopeless/worthless, isolating and irritability.   Pt denied abuse. Pt denied substance use. Pt denied being linked to OPT resources (medication management and/or counseling). Pt reported, not taking medications as prescribed. Pt denied previous inpatient admissions.   Pt presented alert, in scrubs with logical/coherent speech. Pt's eye contact was fair. Pt's mood was depressed. Pt's affect was appropriate to circumstance. Pt's thought process was coherent/relevant. Pt's judgement was unimpaired. Pt's concentration was normal. Pt's insight was fair. Pt's impulse control was poor. Clinician asked the pt if discharged from [facility] could she contract for safety, pt replied,"I hope so." Pt reported, if inpatient treatment was recommended she would sign-in voluntarily.   Diagnosis: Major Depressive Disorder, Recurrent, Severe without Psychotic Features.   Past Medical History:  Past Medical History:  Diagnosis Date  . Allergy   . Anxiety   . Asthma   . Back pain, chronic   . Cancer (HCC)    THYROID  . Depression   . Detrusor dyssynergia   . Diabetes mellitus    TYPE II  . GERD (gastroesophageal reflux  disease)   . Heart murmur   . Hormone disorder    HYPOTHYROIDISM  . Hypercholesterolemia   . Hypertension   . Thyroid disease     Past Surgical History:  Procedure Laterality Date  . ABDOMINAL HYSTERECTOMY     LAVH/RSO/SLING/ANT/COLPO  . COLONOSCOPY  07/16/2010  . EXTERNAL EAR SURGERY    . INNER EAR SURGERY     IMPLANT  . TONSILLECTOMY AND ADENOIDECTOMY    . TUBAL LIGATION      Family History:  Family History  Problem Relation Age of Onset  . Diabetes Mother   . Hypertension Mother   . Heart disease Mother   . Fibromyalgia Mother   . Congestive Heart Failure Mother   . Hypertension Father   . Congestive Heart Failure Father   . Hypertension Sister   . COPD Sister   . OCD Sister   . Fibromyalgia Sister   . Bipolar disorder Sister   . Bipolar disorder Brother   . Bipolar disorder Sister   . Bipolar disorder Maternal Grandfather     Social History:  reports that she has never smoked. She has never used smokeless tobacco. She reports that she drinks alcohol. She reports that she does not use drugs.  Additional Social History:  Alcohol / Drug Use Pain Medications: See MAR Prescriptions: See MAR Over the Counter: See MAR History of alcohol / drug use?: No history of alcohol / drug abuse  CIWA: CIWA-Ar BP: 138/78 Pulse Rate: (!) 116 COWS:    PATIENT STRENGTHS: (choose at least two) Average or above average intelligence General fund of  knowledge  Allergies:  Allergies  Allergen Reactions  . Aspirin Other (See Comments)    Sick on stomach  . Ibuprofen Other (See Comments)    Sick on the stomach    Home Medications:  (Not in a hospital admission)  OB/GYN Status:  No LMP recorded. Patient has had a hysterectomy.  General Assessment Data Location of Assessment: [facility] ED TTS Assessment: In system Is this a Tele or Face-to-Face Assessment?: Face-to-Face Is this an Initial Assessment or a Re-assessment for this encounter?: Initial Assessment Marital status:  Widowed Is patient pregnant?: No Pregnancy Status: No Living Arrangements: Other relatives Can pt return to current living arrangement?: Yes Admission Status: Voluntary Is patient capable of signing voluntary admission?: Yes Referral Source: Self/Family/Friend Insurance type: BCBS     Crisis Care Plan Living Arrangements: Other relatives Legal Guardian: Other: (Self) Name of Psychiatrist: NA Name of Therapist: NA  Education Status Is patient currently in school?: No Current Grade: NA Highest grade of school patient has completed: GED Name of school: NA Contact person: NA  Risk to self with the past 6 months Suicidal Ideation: Yes-Currently Present Has patient been a risk to self within the past 6 months prior to admission? : Yes Suicidal Intent: Yes-Currently Present Has patient had any suicidal intent within the past 6 months prior to admission? : Yes Is patient at risk for suicide?: Yes Suicidal Plan?: Yes-Currently Present Has patient had any suicidal plan within the past 6 months prior to admission? : Yes Specify Current Suicidal Plan: Pt had a plan to cut her wrists.  Access to Means: Yes Specify Access to Suicidal Means: Pt has access to knives.  What has been your use of drugs/alcohol within the last 12 months?: Neg.  Previous Attempts/Gestures: Yes How many times?: 2 Other Self Harm Risks: NA Triggers for Past Attempts: Other (Comment) (Pt reported, loosing son, husband and daughter; cancen. ) Intentional Self Injurious Behavior: Cutting Comment - Self Injurious Behavior: Pt report cutting her wrist yesterday morning as a suicide attempt.  Family Suicide History: No Recent stressful life event(s): Loss (Comment), Other (Comment) (Pt reported, loosing son, husband and daughter; cancer; ) Persecutory voices/beliefs?: No Depression: Yes Depression Symptoms: Feeling worthless/self pity, Feeling angry/irritable, Isolating Substance abuse history and/or treatment for  substance abuse?: No Suicide prevention information given to non-admitted patients: Not applicable  Risk to Others within the past 6 months Homicidal Ideation: No Does patient have any lifetime risk of violence toward others beyond the six months prior to admission? : No Thoughts of Harm to Others: No Current Homicidal Intent: No Current Homicidal Plan: No Access to Homicidal Means: No Identified Victim: NA History of harm to others?: No Assessment of Violence: None Noted Violent Behavior Description: NA Does patient have access to weapons?: Yes (Comment) (knives) Criminal Charges Pending?: No Does patient have a court date: No Is patient on probation?: No  Psychosis Hallucinations: None noted (Pt denies. ) Delusions: None noted  Mental Status Report Appearance/Hygiene: In scrubs Eye Contact: Fair Motor Activity: Freedom of movement Speech: Logical/coherent Level of Consciousness: Alert Mood: Depressed Affect: Appropriate to circumstance Anxiety Level: None Thought Processes: Coherent, Relevant Judgement: Unimpaired Orientation: Other (Comment) (year, city and state. ) Obsessive Compulsive Thoughts/Behaviors: None  Cognitive Functioning Concentration: Normal Memory: Recent Intact IQ: Average Insight: Fair Impulse Control: Poor Appetite: Poor Weight Loss: 50 Weight Gain: 0 Sleep: Decreased Total Hours of Sleep:  (Pt reported, not sleeping.) Vegetative Symptoms: None  ADLScreening [facility] Assessment Services) Patient's cognitive ability adequate to  safely complete daily activities?: Yes Patient able to express need for assistance with ADLs?: Yes Independently performs ADLs?: Yes (appropriate for developmental age)  Prior Inpatient Therapy Prior Inpatient Therapy: No Prior Therapy Dates: Na Prior Therapy Facilty/Provider(s): NA Reason for Treatment: NA  Prior Outpatient Therapy Prior Outpatient Therapy: No Prior Therapy Dates: NA Prior Therapy  Facilty/Provider(s): NA Reason for Treatment: NA Does patient have an ACCT team?: No Does patient have Intensive In-House Services?  : No Does patient have [facility] services? : No Does patient have P4CC services?: No  ADL Screening (condition at time of admission) Patient's cognitive ability adequate to safely complete daily activities?: Yes Is the patient deaf or have difficulty hearing?: Yes (Pt reports having hearing aid. ) Does the patient have difficulty seeing, even when wearing glasses/contacts?: Yes (Pt reports wearing glasses.) Does the patient have difficulty concentrating, remembering, or making decisions?: Yes (Pt reports, sometimes. ) Patient able to express need for assistance with ADLs?: Yes Does the patient have difficulty dressing or bathing?: No Independently performs ADLs?: Yes (appropriate for developmental age) Does the patient have difficulty walking or climbing stairs?: No Weakness of Legs: None Weakness of Arms/Hands: None       Abuse/Neglect Assessment (Assessment to be complete while patient is alone) Physical Abuse: Denies (Pt denies.) Verbal Abuse: Denies (Pt denies.) Sexual Abuse: Denies (Pt denies. ) Exploitation of patient/patient's resources: Denies (Pt denies.) Self-Neglect: Denies (Pt denies.)     Regulatory affairs officer (For Healthcare) Does Patient Have a Medical Advance Directive?: No Would patient like information on creating a medical advance directive?: No - Patient declined    Additional Information 1:1 In Past 12 Months?: No CIRT Risk: No Elopement Risk: No Does patient have medical clearance?: Yes     Disposition: Lindon Romp, NP recommends inpatient treatment. Disposition discussed with Dr. Roxanne Mins and Margaretha Sheffield, RN. TTS to seek placement.   Disposition Initial Assessment Completed for this Encounter: Yes Disposition of Patient: Inpatient treatment program Type of inpatient treatment program: Adult  Edd Fabian 12/13/2016 1:10  AM   Edd Fabian, MS, [facility], [facility] Triage Specialist [phone]

## 2016-12-13 NOTE — ED Provider Notes (Signed)
Pt seen and evaluated. Benign exam. Reassuring labs. After Synthroid ingestion, pts HR has returned to WNL. Expresses that she has GERD, that has worsened since her depression has worsened. +Weight loss.   Stable medically, medically clear. Appropriate for inpatient psychiatric care.    Tanna Furry, MD 12/13/16 [phone]

## 2016-12-13 NOTE — BH Assessment (Signed)
[place] Assessment Progress Note  Per Corena Pilgrim, MD, this pt requires psychiatric hospitalization at this time.  The following facilities have been contacted to seek placement for this pt, with results as noted:  Beds available, information sent, decision pending:  [facility]   At capacity:  [facility] [place], [place] Triage Specialist [phone]

## 2016-12-13 NOTE — ED Notes (Signed)
TTS assessment in progress. 

## 2016-12-13 NOTE — Consult Note (Signed)
[place] [facility] Consult   Reason for Consult:  Depression with suicidal ideation Referring Physician:  EDP Patient Identification: Carrie Macias MRN:  [id number] Principal Diagnosis: Depression with suicidal ideation Diagnosis:   Patient Active Problem List   Diagnosis Date Noted  . Depression with suicidal ideation [F32.9, R45.851] 12/13/2016  . Female cystocele [N81.10] 02/07/2014  . History of vitamin D deficiency [Z86.39] 01/27/2012  . Hypercholesterolemia [E78.00]   . Back pain, chronic [M54.9, G89.29]     Total Time spent with patient: 30 minutes  Subjective:   Carrie Macias is a 61 y.o. female continues to endorse suicidal ideation with a plan. Reports a past attempt within the past 2 weeks. Reports she is not followed by psychiatry at this time. Patient reports multiple stressors. Supports, reassurance and encouragement was provided.   HPI: Per tele assessment notes-Carrie Macias is an 61 y.o. female, who presents voluntary and unaccompanied to [facility]. Pt reported, loosing over fifty pounds, due to a sodium sensitivity. Pt reported, eating a lot of processed foods however now she has to cook and feels her food is not good. Pt reported, the following stressors: taking care of her sister, cancer, the death of her 72 year old son in 13 in a car accident, her husband passing away in 2011, and the loss of her 16 year old daughter in 2012 from pneumonia. Pt reported, "I fell like I'm dying, no way out but to do this." Pt reported, "this" was committing suicide. Pt reported, she attempted suicide yesterday morning by cutting her wrist. Pt reported, attempting suicide "a couple of times." Pt reported, experiencing symptoms of depression: "feeling hopeless/worthless, isolating and irritability.   Pt denied abuse. Pt denied substance use. Pt denied being linked to OPT resources (medication management and/or counseling). Pt reported, not taking medications as prescribed. Pt denied  previous inpatient admissions.   Pt presented alert, in scrubs with logical/coherent speech. Pt's eye contact was fair. Pt's mood was depressed. Pt's affect was appropriate to circumstance. Pt's thought process was coherent/relevant. Pt's judgement was unimpaired. Pt's concentration was normal. Pt's insight was fair. Pt's impulse control was poor. Clinician asked the pt if discharged from [facility] could she contract for safety, pt replied,"I hope so." Pt reported, if inpatient treatment was recommended she would sign-in voluntarily.    Past Psychiatric History:   Risk to Self: Suicidal Ideation: Yes-Currently Present Suicidal Intent: Yes-Currently Present Is patient at risk for suicide?: Yes Suicidal Plan?: Yes-Currently Present Specify Current Suicidal Plan: Pt had a plan to cut her wrists.  Access to Means: Yes Specify Access to Suicidal Means: Pt has access to knives.  What has been your use of drugs/alcohol within the last 12 months?: Neg.  How many times?: 2 Other Self Harm Risks: NA Triggers for Past Attempts: Other (Comment) (Pt reported, loosing son, husband and daughter; cancen. ) Intentional Self Injurious Behavior: Cutting Comment - Self Injurious Behavior: Pt report cutting her wrist yesterday morning as a suicide attempt.  Risk to Others: Homicidal Ideation: No Thoughts of Harm to Others: No Current Homicidal Intent: No Current Homicidal Plan: No Access to Homicidal Means: No Identified Victim: NA History of harm to others?: No Assessment of Violence: None Noted Violent Behavior Description: NA Does patient have access to weapons?: Yes (Comment) (knives) Criminal Charges Pending?: No Does patient have a court date: No Prior Inpatient Therapy: Prior Inpatient Therapy: No Prior Therapy Dates: Na Prior Therapy Facilty/Provider(s): NA Reason for Treatment: NA Prior Outpatient Therapy: Prior Outpatient  Therapy: No Prior Therapy Dates: NA Prior Therapy Facilty/Provider(s):  NA Reason for Treatment: NA Does patient have an ACCT team?: No Does patient have Intensive In-House Services?  : No Does patient have [facility] services? : No Does patient have P4CC services?: No  Past Medical History:  Past Medical History:  Diagnosis Date  . Allergy   . Anxiety   . Asthma   . Back pain, chronic   . Cancer (HCC)    THYROID  . Depression   . Detrusor dyssynergia   . Diabetes mellitus    TYPE II  . GERD (gastroesophageal reflux disease)   . Heart murmur   . Hormone disorder    HYPOTHYROIDISM  . Hypercholesterolemia   . Hypertension   . Thyroid disease     Past Surgical History:  Procedure Laterality Date  . ABDOMINAL HYSTERECTOMY     LAVH/RSO/SLING/ANT/COLPO  . COLONOSCOPY  07/16/2010  . EXTERNAL EAR SURGERY    . INNER EAR SURGERY     IMPLANT  . TONSILLECTOMY AND ADENOIDECTOMY    . TUBAL LIGATION     Family History:  Family History  Problem Relation Age of Onset  . Diabetes Mother   . Hypertension Mother   . Heart disease Mother   . Fibromyalgia Mother   . Congestive Heart Failure Mother   . Hypertension Father   . Congestive Heart Failure Father   . Hypertension Sister   . COPD Sister   . OCD Sister   . Fibromyalgia Sister   . Bipolar disorder Sister   . Bipolar disorder Brother   . Bipolar disorder Sister   . Bipolar disorder Maternal Grandfather    Family Psychiatric  History: Social History:  History  Alcohol Use  . 0.0 oz/week    Comment: SELDOM     History  Drug Use No    Social History   Social History  . Marital status: Widowed    Spouse name: N/A  . Number of children: N/A  . Years of education: N/A   Social History Main Topics  . Smoking status: Never Smoker  . Smokeless tobacco: Never Used  . Alcohol use 0.0 oz/week     Comment: SELDOM  . Drug use: No  . Sexual activity: Not Currently    Birth control/ protection: Surgical     Comment: HYST   Other Topics Concern  . None   Social History Narrative  .  None   Additional Social History:    Allergies:   Allergies  Allergen Reactions  . Aspirin Other (See Comments)    Sick on stomach  . Ibuprofen Other (See Comments)    Sick on the stomach    Labs:  Results for orders placed or performed during the hospital encounter of 12/12/16 (from the past 48 hour(s))  Rapid urine drug screen (hospital performed)     Status: None   Collection Time: 12/12/16  8:04 PM  Result Value Ref Range   Opiates NONE DETECTED NONE DETECTED   Cocaine NONE DETECTED NONE DETECTED   Benzodiazepines NONE DETECTED NONE DETECTED   Amphetamines NONE DETECTED NONE DETECTED   Tetrahydrocannabinol NONE DETECTED NONE DETECTED   Barbiturates NONE DETECTED NONE DETECTED    Comment:        DRUG SCREEN FOR MEDICAL PURPOSES ONLY.  IF CONFIRMATION IS NEEDED FOR ANY PURPOSE, NOTIFY LAB WITHIN 5 DAYS.        LOWEST DETECTABLE LIMITS FOR URINE DRUG SCREEN Drug Class  Cutoff (ng/mL) Amphetamine      1000 Barbiturate      200 Benzodiazepine   185 Tricyclics       631 Opiates          300 Cocaine          300 THC              50   Comprehensive metabolic panel     Status: Abnormal   Collection Time: 12/12/16  8:55 PM  Result Value Ref Range   Sodium 141 135 - 145 mmol/L   Potassium 3.9 3.5 - 5.1 mmol/L   Chloride 107 101 - 111 mmol/L   CO2 26 22 - 32 mmol/L   Glucose, Bld 144 (H) 65 - 99 mg/dL   BUN 12 6 - 20 mg/dL   Creatinine, Ser 0.53 0.44 - 1.00 mg/dL   Calcium 10.0 8.9 - 10.3 mg/dL   Total Protein 7.5 6.5 - 8.1 g/dL   Albumin 4.7 3.5 - 5.0 g/dL   AST 28 15 - 41 U/L   ALT 33 14 - 54 U/L   Alkaline Phosphatase 60 38 - 126 U/L   Total Bilirubin 0.8 0.3 - 1.2 mg/dL   GFR calc non Af Amer >60 >60 mL/min   GFR calc Af Amer >60 >60 mL/min    Comment: (NOTE) The eGFR has been calculated using the CKD EPI equation. This calculation has not been validated in all clinical situations. eGFR's persistently <60 mL/min signify possible Chronic  Kidney Disease.    Anion gap 8 5 - 15  Ethanol     Status: None   Collection Time: 12/12/16  8:55 PM  Result Value Ref Range   Alcohol, Ethyl (B) <5 <5 mg/dL    Comment:        LOWEST DETECTABLE LIMIT FOR SERUM ALCOHOL IS 5 mg/dL FOR MEDICAL PURPOSES ONLY   Salicylate level     Status: None   Collection Time: 12/12/16  8:55 PM  Result Value Ref Range   Salicylate Lvl <4.9 2.8 - 30.0 mg/dL  Acetaminophen level     Status: Abnormal   Collection Time: 12/12/16  8:55 PM  Result Value Ref Range   Acetaminophen (Tylenol), Serum <10 (L) 10 - 30 ug/mL    Comment:        THERAPEUTIC CONCENTRATIONS VARY SIGNIFICANTLY. A RANGE OF 10-30 ug/mL MAY BE AN EFFECTIVE CONCENTRATION FOR MANY PATIENTS. HOWEVER, SOME ARE BEST TREATED AT CONCENTRATIONS OUTSIDE THIS RANGE. ACETAMINOPHEN CONCENTRATIONS >150 ug/mL AT 4 HOURS AFTER INGESTION AND >50 ug/mL AT 12 HOURS AFTER INGESTION ARE OFTEN ASSOCIATED WITH TOXIC REACTIONS.   cbc     Status: None   Collection Time: 12/12/16  8:55 PM  Result Value Ref Range   WBC 7.2 4.0 - 10.5 K/uL   RBC 4.81 3.87 - 5.11 MIL/uL   Hemoglobin 14.9 12.0 - 15.0 g/dL   HCT 43.1 36.0 - 46.0 %   MCV 89.6 78.0 - 100.0 fL   MCH 31.0 26.0 - 34.0 pg   MCHC 34.6 30.0 - 36.0 g/dL   RDW 12.1 11.5 - 15.5 %   Platelets 232 150 - 400 K/uL  TSH     Status: Abnormal   Collection Time: 12/13/16  7:42 AM  Result Value Ref Range   TSH 0.029 (L) 0.350 - 4.500 uIU/mL    Comment: Performed by a 3rd Generation assay with a functional sensitivity of <=0.01 uIU/mL.  Urinalysis, Routine w reflex microscopic  Status: Abnormal   Collection Time: 12/13/16 10:42 AM  Result Value Ref Range   Color, Urine STRAW (A) YELLOW   APPearance CLEAR CLEAR   Specific Gravity, Urine 1.007 1.005 - 1.030   pH 7.0 5.0 - 8.0   Glucose, UA >=500 (A) NEGATIVE mg/dL   Hgb urine dipstick NEGATIVE NEGATIVE   Bilirubin Urine NEGATIVE NEGATIVE   Ketones, ur NEGATIVE NEGATIVE mg/dL   Protein, ur  NEGATIVE NEGATIVE mg/dL   Nitrite NEGATIVE NEGATIVE   Leukocytes, UA NEGATIVE NEGATIVE   RBC / HPF 0-5 0 - 5 RBC/hpf   WBC, UA 0-5 0 - 5 WBC/hpf   Bacteria, UA RARE (A) NONE SEEN   Squamous Epithelial / LPF 0-5 (A) NONE SEEN    Current Facility-Administered Medications  Medication Dose Route Frequency Provider Last Rate Last Dose  . acetaminophen (TYLENOL) tablet 650 mg  650 mg Oral D1V PRN Delora Fuel, MD      . alum & mag hydroxide-simeth (MAALOX/MYLANTA) 200-200-20 MG/5ML suspension 30 mL  30 mL Oral PRN Delora Fuel, MD      . atorvastatin (LIPITOR) tablet 40 mg  40 mg Oral O1607 Delora Fuel, MD      . bisoprolol-hydrochlorothiazide [facility]) 5-6.25 MG per tablet 1 tablet  1 tablet Oral Daily Delora Fuel, MD   1 tablet at 12/13/16 1040  . calcium carbonate (OS-CAL - dosed in mg of elemental calcium) tablet 1,250 mg  1,250 mg Oral BID WC Delora Fuel, MD   3,710 mg at 12/13/16 0759  . cholecalciferol (VITAMIN D) tablet 1,000 Units  1,000 Units Oral Daily Delora Fuel, MD   6,269 Units at 12/13/16 1040  . docusate sodium (COLACE) capsule 100 mg  100 mg Oral Daily Delora Fuel, MD   485 mg at 12/13/16 1040  . levothyroxine (SYNTHROID, LEVOTHROID) tablet 125 mcg  125 mcg Oral Daily Delora Fuel, MD      . LORazepam (ATIVAN) tablet 1 mg  1 mg Oral I6E PRN Delora Fuel, MD      . losartan (COZAAR) tablet 100 mg  100 mg Oral Daily Delora Fuel, MD   703 mg at 12/13/16 1040  . metFORMIN (GLUCOPHAGE-XR) 24 hr tablet 500 mg  500 mg Oral Daily Delora Fuel, MD   500 mg at 12/13/16 0759  . multivitamin with minerals tablet 1 tablet  1 tablet Oral Daily Delora Fuel, MD   1 tablet at 12/13/16 1040  . ondansetron (ZOFRAN) tablet 4 mg  4 mg Oral X3G PRN Delora Fuel, MD      . vitamin C (ASCORBIC ACID) tablet 250 mg  250 mg Oral Daily Delora Fuel, MD   182 mg at 12/13/16 1040  . zolpidem (AMBIEN) tablet 5 mg  5 mg Oral QHS PRN Delora Fuel, MD       Current Outpatient Prescriptions  Medication Sig Dispense Refill   . Ascorbic Acid (VITAMIN C PO) Take 1 tablet by mouth daily.    . bisoprolol-hydrochlorothiazide (ZIAC) 5-6.25 MG per tablet Take 1 tablet by mouth daily.    . calcium carbonate (OS-CAL) 600 MG TABS Take 600 mg by mouth 2 (two) times daily with a meal.    . cholecalciferol (VITAMIN D) 1000 UNITS tablet Take 1,000 Units by mouth daily.    . cyclobenzaprine (FLEXERIL) 10 MG tablet Take 10 mg by mouth 3 (three) times daily as needed.    . docusate sodium (STOOL SOFTENER) 100 MG capsule Take 100 mg by mouth daily.    Marland Kitchen levothyroxine (  SYNTHROID, LEVOTHROID) 88 MCG tablet Take 125 mcg by mouth daily.    Marland Kitchen losartan (COZAAR) 100 MG tablet Take 100 mg by mouth daily.     . Multiple Vitamin (MULTIVITAMIN) tablet Take 1 tablet by mouth daily.    . simvastatin (ZOCOR) 80 MG tablet Take 80 mg by mouth at bedtime.    . traMADol (ULTRAM) 50 MG tablet Take 50 mg by mouth every 6 (six) hours as needed.    . zolpidem (AMBIEN) 10 MG tablet Take 10 mg by mouth at bedtime.  3  . celecoxib (CELEBREX) 200 MG capsule Take 200 mg by mouth 2 (two) times daily.    . metFORMIN (GLUCOPHAGE-XR) 500 MG 24 hr tablet Take 1 tablet by mouth daily.  3    Musculoskeletal: Strength & Muscle Tone: within normal limits Gait & Station: normal Patient leans: N/A  Psychiatric Specialty Exam: Physical Exam  ROS  Blood pressure (!) 144/72, pulse (!) 107, temperature 98 F (36.7 C), temperature source Axillary, resp. rate 15, SpO2 100 %.There is no height or weight on file to calculate BMI.  General Appearance: Casual  Eye Contact:  Fair  Speech:  Clear and Coherent  Volume:  Decreased  Mood:  Depressed and Dysphoric  Affect:  Depressed and Flat  Thought Process:  Coherent  Orientation:  Full (Time, Place, and Person)  Thought Content:  Hallucinations: None  Suicidal Thoughts:  Yes.  with intent/plan  Homicidal Thoughts:  No  Memory:  Immediate;   Fair Recent;   Fair Remote;   Fair  Judgement:  Fair  Insight:  Present   Psychomotor Activity:  Restlessness  Concentration:  Concentration: Fair  Recall:  [employer] of Knowledge:  Fair  Language:  Fair  Akathisia:  No  Handed:  Right  AIMS (if indicated):     Assets:  Communication Skills Desire for Improvement Physical Health Resilience Social Support  ADL's:  Intact  Cognition:  WNL  Sleep:       Disposition: Recommend psychiatric Inpatient admission when medically cleared.  - Seeking placement with Lorrin Goodell- psychiatry or  400 hall bed  Derrill Center, NP 12/13/2016 12:01 PM  Patient seen face-to-face for psychiatric evaluation, chart reviewed and case discussed with the physician extender and developed treatment plan. Reviewed the information documented and agree with the treatment plan. Corena Pilgrim, MD

## 2016-12-13 NOTE — ED Provider Notes (Addendum)
EKG Interpretation  Date/Time:  Monday December 13 2016 06:52:54 EDT Ventricular Rate:  114 PR Interval:    QRS Duration: 90 QT Interval:  334 QTC Calculation: 460 R Axis:   -49 Text Interpretation:  Sinus tachycardia Left anterior fascicular block Anteroseptal infarct, old Previously sinus bradycardia Confirmed by Haddie Bruhl  MD, Jenny Reichmann ([id number]) on 12/13/2016 6:59:59 AM      7:00 AM Patient noted to be tachycardic since arrival. EKG shows sinus tachycardia. This may be due to patient's recent overdose of levothyroxine. Will continue to monitor.     Shanon Rosser, MD 12/13/16 [phone]

## 2016-12-13 NOTE — ED Notes (Addendum)
Received verbal order from Dr. Florina Ou to hold levothyroxine this a.m.dose.

## 2016-12-14 DIAGNOSIS — E785 Hyperlipidemia, unspecified: Secondary | ICD-10-CM | POA: Diagnosis not present

## 2016-12-14 DIAGNOSIS — R45851 Suicidal ideations: Secondary | ICD-10-CM | POA: Diagnosis not present

## 2016-12-14 DIAGNOSIS — E119 Type 2 diabetes mellitus without complications: Secondary | ICD-10-CM | POA: Diagnosis not present

## 2016-12-14 DIAGNOSIS — Z79899 Other long term (current) drug therapy: Secondary | ICD-10-CM | POA: Diagnosis not present

## 2016-12-14 DIAGNOSIS — F323 Major depressive disorder, single episode, severe with psychotic features: Secondary | ICD-10-CM | POA: Diagnosis not present

## 2016-12-14 DIAGNOSIS — G4709 Other insomnia: Secondary | ICD-10-CM | POA: Diagnosis not present

## 2016-12-14 DIAGNOSIS — K219 Gastro-esophageal reflux disease without esophagitis: Secondary | ICD-10-CM | POA: Diagnosis not present

## 2016-12-14 DIAGNOSIS — K5900 Constipation, unspecified: Secondary | ICD-10-CM | POA: Diagnosis not present

## 2016-12-14 DIAGNOSIS — Z85850 Personal history of malignant neoplasm of thyroid: Secondary | ICD-10-CM | POA: Diagnosis not present

## 2016-12-14 DIAGNOSIS — E039 Hypothyroidism, unspecified: Secondary | ICD-10-CM | POA: Diagnosis not present

## 2016-12-14 DIAGNOSIS — J45909 Unspecified asthma, uncomplicated: Secondary | ICD-10-CM | POA: Diagnosis not present

## 2016-12-14 DIAGNOSIS — F333 Major depressive disorder, recurrent, severe with psychotic symptoms: Secondary | ICD-10-CM | POA: Diagnosis not present

## 2016-12-14 DIAGNOSIS — Z7984 Long term (current) use of oral hypoglycemic drugs: Secondary | ICD-10-CM | POA: Diagnosis not present

## 2016-12-14 DIAGNOSIS — F322 Major depressive disorder, single episode, severe without psychotic features: Secondary | ICD-10-CM | POA: Diagnosis not present

## 2016-12-14 DIAGNOSIS — Z9114 Patient's other noncompliance with medication regimen: Secondary | ICD-10-CM | POA: Diagnosis not present

## 2016-12-14 DIAGNOSIS — I10 Essential (primary) hypertension: Secondary | ICD-10-CM | POA: Diagnosis not present

## 2016-12-14 NOTE — ED Notes (Signed)
Report called to [facility] and transportation arranged through [employer].

## 2016-12-14 NOTE — BH Assessment (Addendum)
[facility] Assessment Progress Note  Please see Roderic Palau Riffey's note dated 12/13/2016 @ 10:42 PM.  This Probation officer has spoken to pt, who is currently under voluntary status, and she agrees to transfer to [facility].  EDP Theotis Burrow, MD, concurs with this decision.  Communication with pt's nurse is pending at this time.  Jalene Mullet, MA Triage Specialist [phone]  Addendum:  Pt's nurse, Caryl Comes, has been notified.  Jalene Mullet, [place] Triage Specialist [phone]

## 2016-12-14 NOTE — ED Notes (Signed)
Report called to [facility]. They asked that we wait until 1030 to call for transportation.

## 2016-12-15 DIAGNOSIS — F333 Major depressive disorder, recurrent, severe with psychotic symptoms: Secondary | ICD-10-CM | POA: Diagnosis not present

## 2016-12-16 DIAGNOSIS — F333 Major depressive disorder, recurrent, severe with psychotic symptoms: Secondary | ICD-10-CM | POA: Diagnosis not present

## 2016-12-17 DIAGNOSIS — F333 Major depressive disorder, recurrent, severe with psychotic symptoms: Secondary | ICD-10-CM | POA: Diagnosis not present

## 2016-12-18 DIAGNOSIS — F333 Major depressive disorder, recurrent, severe with psychotic symptoms: Secondary | ICD-10-CM | POA: Diagnosis not present

## 2016-12-19 DIAGNOSIS — F333 Major depressive disorder, recurrent, severe with psychotic symptoms: Secondary | ICD-10-CM | POA: Diagnosis not present

## 2016-12-20 DIAGNOSIS — F333 Major depressive disorder, recurrent, severe with psychotic symptoms: Secondary | ICD-10-CM | POA: Diagnosis not present

## 2016-12-21 DIAGNOSIS — F333 Major depressive disorder, recurrent, severe with psychotic symptoms: Secondary | ICD-10-CM | POA: Diagnosis not present

## 2016-12-26 ENCOUNTER — Inpatient Hospital Stay (HOSPITAL_COMMUNITY)
Admission: EM | Admit: 2016-12-26 | Discharge: 2016-12-29 | DRG: 871 | Disposition: A | Discharge status: Home / Self Care | Payer: BLUE CROSS/BLUE SHIELD | Attending: Internal Medicine | Admitting: Internal Medicine

## 2016-12-26 ENCOUNTER — Emergency Department (HOSPITAL_COMMUNITY): Payer: BLUE CROSS/BLUE SHIELD

## 2016-12-26 DIAGNOSIS — R778 Other specified abnormalities of plasma proteins: Secondary | ICD-10-CM | POA: Diagnosis present

## 2016-12-26 DIAGNOSIS — E079 Disorder of thyroid, unspecified: Secondary | ICD-10-CM | POA: Diagnosis present

## 2016-12-26 DIAGNOSIS — E785 Hyperlipidemia, unspecified: Secondary | ICD-10-CM | POA: Diagnosis not present

## 2016-12-26 DIAGNOSIS — F322 Major depressive disorder, single episode, severe without psychotic features: Secondary | ICD-10-CM | POA: Diagnosis not present

## 2016-12-26 DIAGNOSIS — M549 Dorsalgia, unspecified: Secondary | ICD-10-CM | POA: Diagnosis present

## 2016-12-26 DIAGNOSIS — E039 Hypothyroidism, unspecified: Secondary | ICD-10-CM | POA: Diagnosis not present

## 2016-12-26 DIAGNOSIS — E7800 Pure hypercholesterolemia, unspecified: Secondary | ICD-10-CM | POA: Diagnosis not present

## 2016-12-26 DIAGNOSIS — G8929 Other chronic pain: Secondary | ICD-10-CM | POA: Diagnosis present

## 2016-12-26 DIAGNOSIS — I471 Supraventricular tachycardia, unspecified: Secondary | ICD-10-CM

## 2016-12-26 DIAGNOSIS — R1110 Vomiting, unspecified: Secondary | ICD-10-CM | POA: Diagnosis not present

## 2016-12-26 DIAGNOSIS — E872 Acidosis: Secondary | ICD-10-CM | POA: Diagnosis not present

## 2016-12-26 DIAGNOSIS — Z681 Body mass index (BMI) 19 or less, adult: Secondary | ICD-10-CM

## 2016-12-26 DIAGNOSIS — E860 Dehydration: Secondary | ICD-10-CM | POA: Diagnosis not present

## 2016-12-26 DIAGNOSIS — A419 Sepsis, unspecified organism: Secondary | ICD-10-CM | POA: Diagnosis not present

## 2016-12-26 DIAGNOSIS — R197 Diarrhea, unspecified: Secondary | ICD-10-CM | POA: Diagnosis present

## 2016-12-26 DIAGNOSIS — E1165 Type 2 diabetes mellitus with hyperglycemia: Secondary | ICD-10-CM | POA: Diagnosis present

## 2016-12-26 DIAGNOSIS — F341 Dysthymic disorder: Secondary | ICD-10-CM | POA: Diagnosis not present

## 2016-12-26 DIAGNOSIS — E890 Postprocedural hypothyroidism: Secondary | ICD-10-CM | POA: Diagnosis not present

## 2016-12-26 DIAGNOSIS — Z85850 Personal history of malignant neoplasm of thyroid: Secondary | ICD-10-CM

## 2016-12-26 DIAGNOSIS — Z79891 Long term (current) use of opiate analgesic: Secondary | ICD-10-CM | POA: Diagnosis not present

## 2016-12-26 DIAGNOSIS — Z825 Family history of asthma and other chronic lower respiratory diseases: Secondary | ICD-10-CM

## 2016-12-26 DIAGNOSIS — I10 Essential (primary) hypertension: Secondary | ICD-10-CM | POA: Diagnosis not present

## 2016-12-26 DIAGNOSIS — F39 Unspecified mood [affective] disorder: Secondary | ICD-10-CM | POA: Diagnosis not present

## 2016-12-26 DIAGNOSIS — Z818 Family history of other mental and behavioral disorders: Secondary | ICD-10-CM | POA: Diagnosis not present

## 2016-12-26 DIAGNOSIS — R0789 Other chest pain: Secondary | ICD-10-CM | POA: Diagnosis not present

## 2016-12-26 DIAGNOSIS — R45851 Suicidal ideations: Secondary | ICD-10-CM

## 2016-12-26 DIAGNOSIS — Z833 Family history of diabetes mellitus: Secondary | ICD-10-CM

## 2016-12-26 DIAGNOSIS — R7989 Other specified abnormal findings of blood chemistry: Secondary | ICD-10-CM | POA: Diagnosis not present

## 2016-12-26 DIAGNOSIS — F32A Depression, unspecified: Secondary | ICD-10-CM

## 2016-12-26 DIAGNOSIS — N390 Urinary tract infection, site not specified: Secondary | ICD-10-CM

## 2016-12-26 DIAGNOSIS — Z886 Allergy status to analgesic agent status: Secondary | ICD-10-CM | POA: Diagnosis not present

## 2016-12-26 DIAGNOSIS — E876 Hypokalemia: Secondary | ICD-10-CM | POA: Diagnosis present

## 2016-12-26 DIAGNOSIS — Z8249 Family history of ischemic heart disease and other diseases of the circulatory system: Secondary | ICD-10-CM

## 2016-12-26 DIAGNOSIS — E739 Lactose intolerance, unspecified: Secondary | ICD-10-CM | POA: Diagnosis not present

## 2016-12-26 DIAGNOSIS — E119 Type 2 diabetes mellitus without complications: Secondary | ICD-10-CM | POA: Diagnosis not present

## 2016-12-26 DIAGNOSIS — K219 Gastro-esophageal reflux disease without esophagitis: Secondary | ICD-10-CM | POA: Diagnosis present

## 2016-12-26 DIAGNOSIS — R079 Chest pain, unspecified: Secondary | ICD-10-CM | POA: Diagnosis not present

## 2016-12-26 DIAGNOSIS — R109 Unspecified abdominal pain: Secondary | ICD-10-CM | POA: Diagnosis not present

## 2016-12-26 DIAGNOSIS — F329 Major depressive disorder, single episode, unspecified: Secondary | ICD-10-CM

## 2016-12-26 DIAGNOSIS — E43 Unspecified severe protein-calorie malnutrition: Secondary | ICD-10-CM | POA: Insufficient documentation

## 2016-12-26 DIAGNOSIS — Z9114 Patient's other noncompliance with medication regimen: Secondary | ICD-10-CM | POA: Diagnosis not present

## 2016-12-26 DIAGNOSIS — D696 Thrombocytopenia, unspecified: Secondary | ICD-10-CM | POA: Diagnosis not present

## 2016-12-26 DIAGNOSIS — Z79899 Other long term (current) drug therapy: Secondary | ICD-10-CM | POA: Diagnosis not present

## 2016-12-26 LAB — COMPREHENSIVE METABOLIC PANEL
ALBUMIN: 4.1 g/dL (ref 3.5–5.0)
ALT: 30 U/L (ref 14–54)
AST: 31 U/L (ref 15–41)
Alkaline Phosphatase: 67 U/L (ref 38–126)
Anion gap: 18 — ABNORMAL HIGH (ref 5–15)
BUN: 36 mg/dL — AB (ref 6–20)
CHLORIDE: 111 mmol/L (ref 101–111)
CO2: 13 mmol/L — ABNORMAL LOW (ref 22–32)
Calcium: 10.4 mg/dL — ABNORMAL HIGH (ref 8.9–10.3)
Creatinine, Ser: 0.92 mg/dL (ref 0.44–1.00)
GFR calc Af Amer: >60 mL/min (ref >60)
GFR calc non Af Amer: >60 mL/min (ref >60)
GLUCOSE: 183 mg/dL — AB (ref 65–99)
POTASSIUM: 4.8 mmol/L (ref 3.5–5.1)
Sodium: 142 mmol/L (ref 135–145)
Total Bilirubin: 2 mg/dL — ABNORMAL HIGH (ref 0.3–1.2)
Total Protein: 7.2 g/dL (ref 6.5–8.1)

## 2016-12-26 LAB — ETHANOL

## 2016-12-26 LAB — URINALYSIS, ROUTINE W REFLEX MICROSCOPIC
BACTERIA UA: NONE SEEN
Bilirubin Urine: NEGATIVE
Glucose, UA: 50 mg/dL — AB
HGB URINE DIPSTICK: NEGATIVE
Ketones, ur: 80 mg/dL — AB
Nitrite: NEGATIVE
PROTEIN: 100 mg/dL — AB
Specific Gravity, Urine: 1.028 (ref 1.005–1.030)
pH: 5 (ref 5.0–8.0)

## 2016-12-26 LAB — I-STAT CG4 LACTIC ACID, ED: LACTIC ACID, VENOUS: 3.98 mmol/L — AB (ref 0.5–1.9)

## 2016-12-26 LAB — I-STAT VENOUS BLOOD GAS, ED
Acid-base deficit: 6 mmol/L — ABNORMAL HIGH (ref 0.0–2.0)
BICARBONATE: 19 mmol/L — AB (ref 20.0–28.0)
O2 Saturation: 81 %
PH VEN: 7.334 (ref 7.250–7.430)
PO2 VEN: 47 mmHg — AB (ref 32.0–45.0)
TCO2: 20 mmol/L (ref 0–100)
pCO2, Ven: 35.7 mmHg — ABNORMAL LOW (ref 44.0–60.0)

## 2016-12-26 LAB — CBC
HEMATOCRIT: 52.2 % — AB (ref 36.0–46.0)
Hemoglobin: 18.2 g/dL — ABNORMAL HIGH (ref 12.0–15.0)
MCH: 31.7 pg (ref 26.0–34.0)
MCHC: 34.9 g/dL (ref 30.0–36.0)
MCV: 90.9 fL (ref 78.0–100.0)
Platelets: 284 10*3/uL (ref 150–400)
RBC: 5.74 MIL/uL — ABNORMAL HIGH (ref 3.87–5.11)
RDW: 12 % (ref 11.5–15.5)
WBC: 10.8 10*3/uL — ABNORMAL HIGH (ref 4.0–10.5)

## 2016-12-26 LAB — LIPASE, BLOOD: LIPASE: 40 U/L (ref 11–51)

## 2016-12-26 LAB — SALICYLATE LEVEL

## 2016-12-26 LAB — I-STAT TROPONIN, ED: TROPONIN I, POC: 0.01 ng/mL (ref 0.00–0.08)

## 2016-12-26 LAB — ACETAMINOPHEN LEVEL

## 2016-12-26 MED ORDER — SODIUM CHLORIDE 0.9 % IV BOLUS (SEPSIS)
1000.0000 mL | Freq: Once | INTRAVENOUS | Status: AC
Start: 2016-12-26 — End: 2016-12-27
  Administered 2016-12-26: 1000 mL via INTRAVENOUS

## 2016-12-26 MED ORDER — SODIUM CHLORIDE 0.9 % IV BOLUS (SEPSIS)
1000.0000 mL | Freq: Once | INTRAVENOUS | Status: AC
  Administered 2016-12-26: 1000 mL via INTRAVENOUS

## 2016-12-26 MED ORDER — PIPERACILLIN-TAZOBACTAM 3.375 G IVPB 30 MIN
3.3750 g | Freq: Once | INTRAVENOUS | Status: AC
  Administered 2016-12-26: 3.375 g via INTRAVENOUS
  Filled 2016-12-26: qty 50

## 2016-12-26 MED ORDER — DEXTROSE 5 % IV SOLN: 2.0000 g | Freq: Once | INTRAVENOUS | Status: DC

## 2016-12-26 MED ORDER — IOPAMIDOL (ISOVUE-300) INJECTION 61%
INTRAVENOUS | Status: AC
  Administered 2016-12-26: 100 mL
  Filled 2016-12-26: qty 100

## 2016-12-26 NOTE — ED Notes (Signed)
[facility] contacted to activate Code Sepsis 

## 2016-12-26 NOTE — ED Notes (Signed)
Pt tachy at triage. HR = 152

## 2016-12-26 NOTE — ED Triage Notes (Signed)
Pt states unable to eat or drink. Pt states ongoing "for a long time." Pt states some dysuria. Pt denies any vaginal bleeding/discharge. Pt denies any abdominal pain.

## 2016-12-26 NOTE — ED Notes (Signed)
Patient transported to CT 

## 2016-12-27 ENCOUNTER — Encounter (HOSPITAL_COMMUNITY): Payer: Self-pay | Admitting: General Practice

## 2016-12-27 DIAGNOSIS — E860 Dehydration: Secondary | ICD-10-CM | POA: Diagnosis present

## 2016-12-27 DIAGNOSIS — Z8249 Family history of ischemic heart disease and other diseases of the circulatory system: Secondary | ICD-10-CM

## 2016-12-27 DIAGNOSIS — A419 Sepsis, unspecified organism: Secondary | ICD-10-CM | POA: Diagnosis not present

## 2016-12-27 DIAGNOSIS — F341 Dysthymic disorder: Secondary | ICD-10-CM | POA: Diagnosis not present

## 2016-12-27 DIAGNOSIS — E43 Unspecified severe protein-calorie malnutrition: Secondary | ICD-10-CM | POA: Insufficient documentation

## 2016-12-27 DIAGNOSIS — E7800 Pure hypercholesterolemia, unspecified: Secondary | ICD-10-CM | POA: Diagnosis present

## 2016-12-27 DIAGNOSIS — Z79899 Other long term (current) drug therapy: Secondary | ICD-10-CM

## 2016-12-27 DIAGNOSIS — E876 Hypokalemia: Secondary | ICD-10-CM | POA: Diagnosis present

## 2016-12-27 DIAGNOSIS — D696 Thrombocytopenia, unspecified: Secondary | ICD-10-CM | POA: Diagnosis present

## 2016-12-27 DIAGNOSIS — M549 Dorsalgia, unspecified: Secondary | ICD-10-CM

## 2016-12-27 DIAGNOSIS — Z886 Allergy status to analgesic agent status: Secondary | ICD-10-CM

## 2016-12-27 DIAGNOSIS — E739 Lactose intolerance, unspecified: Secondary | ICD-10-CM | POA: Diagnosis present

## 2016-12-27 DIAGNOSIS — E119 Type 2 diabetes mellitus without complications: Secondary | ICD-10-CM

## 2016-12-27 DIAGNOSIS — R197 Diarrhea, unspecified: Secondary | ICD-10-CM | POA: Diagnosis present

## 2016-12-27 DIAGNOSIS — Z681 Body mass index (BMI) 19 or less, adult: Secondary | ICD-10-CM | POA: Diagnosis not present

## 2016-12-27 DIAGNOSIS — Z833 Family history of diabetes mellitus: Secondary | ICD-10-CM

## 2016-12-27 DIAGNOSIS — G8929 Other chronic pain: Secondary | ICD-10-CM | POA: Diagnosis present

## 2016-12-27 DIAGNOSIS — I471 Supraventricular tachycardia: Secondary | ICD-10-CM

## 2016-12-27 DIAGNOSIS — R778 Other specified abnormalities of plasma proteins: Secondary | ICD-10-CM | POA: Diagnosis present

## 2016-12-27 DIAGNOSIS — Z79891 Long term (current) use of opiate analgesic: Secondary | ICD-10-CM

## 2016-12-27 DIAGNOSIS — R0789 Other chest pain: Secondary | ICD-10-CM | POA: Diagnosis not present

## 2016-12-27 DIAGNOSIS — K219 Gastro-esophageal reflux disease without esophagitis: Secondary | ICD-10-CM | POA: Diagnosis not present

## 2016-12-27 DIAGNOSIS — Z825 Family history of asthma and other chronic lower respiratory diseases: Secondary | ICD-10-CM

## 2016-12-27 DIAGNOSIS — E079 Disorder of thyroid, unspecified: Secondary | ICD-10-CM | POA: Diagnosis present

## 2016-12-27 DIAGNOSIS — Z8269 Family history of other diseases of the musculoskeletal system and connective tissue: Secondary | ICD-10-CM

## 2016-12-27 DIAGNOSIS — E039 Hypothyroidism, unspecified: Secondary | ICD-10-CM

## 2016-12-27 DIAGNOSIS — R7989 Other specified abnormal findings of blood chemistry: Secondary | ICD-10-CM | POA: Diagnosis not present

## 2016-12-27 DIAGNOSIS — F322 Major depressive disorder, single episode, severe without psychotic features: Secondary | ICD-10-CM

## 2016-12-27 DIAGNOSIS — Z818 Family history of other mental and behavioral disorders: Secondary | ICD-10-CM

## 2016-12-27 DIAGNOSIS — Z85850 Personal history of malignant neoplasm of thyroid: Secondary | ICD-10-CM | POA: Diagnosis not present

## 2016-12-27 DIAGNOSIS — F329 Major depressive disorder, single episode, unspecified: Secondary | ICD-10-CM

## 2016-12-27 DIAGNOSIS — E872 Acidosis: Secondary | ICD-10-CM | POA: Diagnosis present

## 2016-12-27 DIAGNOSIS — E890 Postprocedural hypothyroidism: Secondary | ICD-10-CM | POA: Diagnosis present

## 2016-12-27 DIAGNOSIS — I10 Essential (primary) hypertension: Secondary | ICD-10-CM

## 2016-12-27 DIAGNOSIS — E785 Hyperlipidemia, unspecified: Secondary | ICD-10-CM | POA: Diagnosis present

## 2016-12-27 DIAGNOSIS — F39 Unspecified mood [affective] disorder: Secondary | ICD-10-CM

## 2016-12-27 DIAGNOSIS — R45851 Suicidal ideations: Secondary | ICD-10-CM

## 2016-12-27 DIAGNOSIS — Z9114 Patient's other noncompliance with medication regimen: Secondary | ICD-10-CM

## 2016-12-27 DIAGNOSIS — N390 Urinary tract infection, site not specified: Secondary | ICD-10-CM | POA: Diagnosis present

## 2016-12-27 DIAGNOSIS — E1165 Type 2 diabetes mellitus with hyperglycemia: Secondary | ICD-10-CM | POA: Diagnosis present

## 2016-12-27 DIAGNOSIS — R079 Chest pain, unspecified: Secondary | ICD-10-CM | POA: Diagnosis present

## 2016-12-27 LAB — TROPONIN I
TROPONIN I: 0.03 ng/mL — AB (ref <0.03)
Troponin I: 0.03 ng/mL (ref <0.03)

## 2016-12-27 LAB — BASIC METABOLIC PANEL
Anion gap: 11 (ref 5–15)
BUN: 22 mg/dL — AB (ref 6–20)
CALCIUM: 8.8 mg/dL — AB (ref 8.9–10.3)
CHLORIDE: 111 mmol/L (ref 101–111)
CO2: 18 mmol/L — ABNORMAL LOW (ref 22–32)
CREATININE: 0.54 mg/dL (ref 0.44–1.00)
Glucose, Bld: 115 mg/dL — ABNORMAL HIGH (ref 65–99)
Potassium: 3.1 mmol/L — ABNORMAL LOW (ref 3.5–5.1)
SODIUM: 140 mmol/L (ref 135–145)

## 2016-12-27 LAB — TSH

## 2016-12-27 LAB — PROCALCITONIN: Procalcitonin: <0.1 ng/mL

## 2016-12-27 LAB — CBC
HCT: 42.7 % (ref 36.0–46.0)
HCT: 40.6 % (ref 36.0–46.0)
Hemoglobin: 14.7 g/dL (ref 12.0–15.0)
Hemoglobin: 14.1 g/dL (ref 12.0–15.0)
MCH: 31.3 pg (ref 26.0–34.0)
MCH: 31.3 pg (ref 26.0–34.0)
MCHC: 34.4 g/dL (ref 30.0–36.0)
MCHC: 34.7 g/dL (ref 30.0–36.0)
MCV: 90.9 fL (ref 78.0–100.0)
MCV: 90.2 fL (ref 78.0–100.0)
PLATELETS: 187 10*3/uL (ref 150–400)
PLATELETS: 189 10*3/uL (ref 150–400)
RBC: 4.7 MIL/uL (ref 3.87–5.11)
RBC: 4.5 MIL/uL (ref 3.87–5.11)
RDW: 12.2 % (ref 11.5–15.5)
RDW: 12.3 % (ref 11.5–15.5)
WBC: 14.2 10*3/uL — ABNORMAL HIGH (ref 4.0–10.5)
WBC: 12.9 10*3/uL — AB (ref 4.0–10.5)

## 2016-12-27 LAB — GLUCOSE, CAPILLARY
GLUCOSE-CAPILLARY: 129 mg/dL — AB (ref 65–99)
GLUCOSE-CAPILLARY: 156 mg/dL — AB (ref 65–99)
GLUCOSE-CAPILLARY: 221 mg/dL — AB (ref 65–99)
Glucose-Capillary: 169 mg/dL — ABNORMAL HIGH (ref 65–99)
Glucose-Capillary: 190 mg/dL — ABNORMAL HIGH (ref 65–99)

## 2016-12-27 LAB — LACTIC ACID, PLASMA
LACTIC ACID, VENOUS: 1.7 mmol/L (ref 0.5–1.9)
LACTIC ACID, VENOUS: 1 mmol/L (ref 0.5–1.9)

## 2016-12-27 LAB — PROTIME-INR
INR: 1.23
PROTHROMBIN TIME: 15.6 s — AB (ref 11.4–15.2)

## 2016-12-27 LAB — CREATININE, SERUM
Creatinine, Ser: 0.65 mg/dL (ref 0.44–1.00)
GFR calc Af Amer: >60 mL/min (ref >60)
GFR calc non Af Amer: >60 mL/min (ref >60)

## 2016-12-27 LAB — CBG MONITORING, ED: Glucose-Capillary: 144 mg/dL — ABNORMAL HIGH (ref 65–99)

## 2016-12-27 LAB — HIV ANTIBODY (ROUTINE TESTING W REFLEX): HIV SCREEN 4TH GENERATION: NONREACTIVE

## 2016-12-27 LAB — APTT: aPTT: 25 seconds (ref 24–36)

## 2016-12-27 LAB — I-STAT CG4 LACTIC ACID, ED: LACTIC ACID, VENOUS: 3.21 mmol/L — AB (ref 0.5–1.9)

## 2016-12-27 MED ORDER — DEXTROSE 5 % IV SOLN
2.0000 g | INTRAVENOUS | Status: DC
  Administered 2016-12-28: 2 g via INTRAVENOUS
  Filled 2016-12-27 (×2): qty 2

## 2016-12-27 MED ORDER — INSULIN ASPART 100 UNIT/ML ~~LOC~~ SOLN
0.0000 [IU] | Freq: Every day | SUBCUTANEOUS | Status: DC
Start: 2016-12-27 — End: 2016-12-29
  Administered 2016-12-28: 2 [IU] via SUBCUTANEOUS

## 2016-12-27 MED ORDER — SODIUM CHLORIDE 0.9 % IV SOLN
INTRAVENOUS | Status: DC
  Administered 2016-12-27: 11:00:00 via INTRAVENOUS

## 2016-12-27 MED ORDER — MIRTAZAPINE 7.5 MG PO TABS
7.5000 mg | ORAL_TABLET | Freq: Every day | ORAL | Status: DC
  Administered 2016-12-27 – 2016-12-28 (×2): 7.5 mg via ORAL
  Filled 2016-12-27 (×2): qty 1

## 2016-12-27 MED ORDER — OXYCODONE HCL 5 MG PO TABS: 5.0000 mg | ORAL_TABLET | ORAL | Status: DC | PRN

## 2016-12-27 MED ORDER — PANTOPRAZOLE SODIUM 40 MG PO TBEC
40.0000 mg | DELAYED_RELEASE_TABLET | Freq: Every day | ORAL | Status: DC
  Administered 2016-12-27 – 2016-12-29 (×3): 40 mg via ORAL
  Filled 2016-12-27 (×3): qty 1

## 2016-12-27 MED ORDER — METOPROLOL SUCCINATE ER 25 MG PO TB24
25.0000 mg | ORAL_TABLET | Freq: Every day | ORAL | Status: DC
  Administered 2016-12-27 – 2016-12-28 (×2): 25 mg via ORAL
  Filled 2016-12-27 (×2): qty 1

## 2016-12-27 MED ORDER — SODIUM CHLORIDE 0.9 % IV SOLN
INTRAVENOUS | Status: DC
  Administered 2016-12-27: 02:00:00 via INTRAVENOUS

## 2016-12-27 MED ORDER — DEXTROSE 5 % IV SOLN
2.0000 g | Freq: Once | INTRAVENOUS | Status: AC
  Administered 2016-12-27: 2 g via INTRAVENOUS
  Filled 2016-12-27: qty 2

## 2016-12-27 MED ORDER — ONDANSETRON HCL 4 MG PO TABS: 4.0000 mg | ORAL_TABLET | Freq: Four times a day (QID) | ORAL | Status: DC | PRN

## 2016-12-27 MED ORDER — METOPROLOL TARTRATE 5 MG/5ML IV SOLN
5.0000 mg | Freq: Four times a day (QID) | INTRAVENOUS | Status: DC | PRN
  Administered 2016-12-27: 5 mg via INTRAVENOUS
  Filled 2016-12-27: qty 5

## 2016-12-27 MED ORDER — ONDANSETRON HCL 4 MG/2ML IJ SOLN: 4.0000 mg | Freq: Four times a day (QID) | INTRAMUSCULAR | Status: DC | PRN

## 2016-12-27 MED ORDER — POTASSIUM CHLORIDE CRYS ER 20 MEQ PO TBCR
40.0000 meq | EXTENDED_RELEASE_TABLET | Freq: Once | ORAL | Status: AC
  Administered 2016-12-27: 40 meq via ORAL
  Filled 2016-12-27: qty 2

## 2016-12-27 MED ORDER — GLUCERNA SHAKE PO LIQD
237.0000 mL | Freq: Two times a day (BID) | ORAL | Status: DC
  Administered 2016-12-27 – 2016-12-29 (×2): 237 mL via ORAL

## 2016-12-27 MED ORDER — METOPROLOL TARTRATE 25 MG PO TABS: 25.0000 mg | ORAL_TABLET | Freq: Two times a day (BID) | ORAL | Status: DC

## 2016-12-27 MED ORDER — ACETAMINOPHEN 325 MG PO TABS: 650.0000 mg | ORAL_TABLET | Freq: Four times a day (QID) | ORAL | Status: DC | PRN

## 2016-12-27 MED ORDER — SODIUM CHLORIDE 0.9% FLUSH
3.0000 mL | Freq: Two times a day (BID) | INTRAVENOUS | Status: DC
  Administered 2016-12-27 – 2016-12-28 (×2): 3 mL via INTRAVENOUS

## 2016-12-27 MED ORDER — ACETAMINOPHEN 650 MG RE SUPP
650.0000 mg | Freq: Four times a day (QID) | RECTAL | Status: DC | PRN
  Administered 2016-12-27: 650 mg via RECTAL
  Filled 2016-12-27: qty 1

## 2016-12-27 MED ORDER — ENOXAPARIN SODIUM 40 MG/0.4ML ~~LOC~~ SOLN
40.0000 mg | SUBCUTANEOUS | Status: DC
  Administered 2016-12-27 – 2016-12-29 (×2): 40 mg via SUBCUTANEOUS
  Filled 2016-12-27 (×3): qty 0.4

## 2016-12-27 MED ORDER — ESCITALOPRAM OXALATE 10 MG PO TABS
5.0000 mg | ORAL_TABLET | Freq: Every day | ORAL | Status: DC
  Administered 2016-12-27 – 2016-12-29 (×3): 5 mg via ORAL
  Filled 2016-12-27 (×3): qty 1

## 2016-12-27 MED ORDER — INSULIN ASPART 100 UNIT/ML ~~LOC~~ SOLN
0.0000 [IU] | Freq: Three times a day (TID) | SUBCUTANEOUS | Status: DC
  Administered 2016-12-27: 2 [IU] via SUBCUTANEOUS
  Administered 2016-12-27: 3 [IU] via SUBCUTANEOUS
  Administered 2016-12-27: 2 [IU] via SUBCUTANEOUS
  Administered 2016-12-28: 1 [IU] via SUBCUTANEOUS
  Administered 2016-12-28: 3 [IU] via SUBCUTANEOUS
  Administered 2016-12-28: 1 [IU] via SUBCUTANEOUS
  Administered 2016-12-29: 2 [IU] via SUBCUTANEOUS

## 2016-12-27 NOTE — Consult Note (Addendum)
Reason for Consult: SVT   Referring Physician: Dr. Doyle Askew   PCP:  Shirline Frees, MD  Primary Cardiologist:new  Carrie Macias is an 61 y.o. female.    Chief Complaint: admitted 12/26/16 with being unable to eat or drink.   HPI: Carrie Macias is a 61 y.o. female who is being seen today for the evaluation of SVT at the request of Dr. Doyle Askew, Hospitalist .    Pt has not hx of CAD and was admitted 12/26/16 with nausea and decreased po intake.  She has a hx of thyroid cancer with surgery, and hypothyroidism, GERD, HLD, HTN, DM2, depression and anxiety, chronic back pain.  She complained of nausea and dysuria on admit.   Pt tells me she did have a stress test several years ago with Dr. Radford Pax that was ok.  In the ED, she was found to be acidotic with pH of 7.33, Bicarb of 13.  She had a mildly elevated glucose and an AG of 18.  She had a lactic acid around 4.  Her WBC was 10.8, but in the setting of high hgb, likely related to dehydration.  BUN was 36.  CT abdomen and CXR did not show any acute source of infection.  Pt has also admitted to attemping suicide.    EKG on admit with HR 156, SVT, with small R waves in inf leads and Q wave in V2, these are similar to old EKGs.  Troponins 0.01; 0.03; 0.03 and <0.03  TSH is < 0.010 and in 11/2016 TSH was 0.029.   CXR with aortic atherosclerosis but no acute process.  Since admit with HR as high as 141 in ST with activity.  Psych has seen and not danger to her self.  She has one other episode of HR > 150.  Pt may have episodes at home but it may just be anxiety.  Does not bother her too much.  No chest pain or SOB.     Past Medical History:  Diagnosis Date  . Allergy   . Anxiety   . Asthma   . Back pain, chronic   . Cancer (HCC)    THYROID  . Depression   . Detrusor dyssynergia   . Diabetes mellitus    TYPE II  . GERD (gastroesophageal reflux disease)   . Heart murmur   . Hormone disorder    HYPOTHYROIDISM  .  Hypercholesterolemia   . Hypertension   . Thyroid disease     Past Surgical History:  Procedure Laterality Date  . ABDOMINAL HYSTERECTOMY     LAVH/RSO/SLING/ANT/COLPO  . COLONOSCOPY  07/16/2010  . EXTERNAL EAR SURGERY    . INNER EAR SURGERY     IMPLANT  . TONSILLECTOMY AND ADENOIDECTOMY    . TUBAL LIGATION      Family History  Problem Relation Age of Onset  . Diabetes Mother   . Hypertension Mother   . Heart disease Mother   . Fibromyalgia Mother   . Congestive Heart Failure Mother   . Hypertension Father   . Congestive Heart Failure Father   . Hypertension Sister   . COPD Sister   . OCD Sister   . Fibromyalgia Sister   . Bipolar disorder Sister   . Bipolar disorder Brother   . Bipolar disorder Sister   . Bipolar disorder Maternal Grandfather    Social History:  reports that she has never smoked. She has never used smokeless tobacco. She reports that  she drinks alcohol. She reports that she does not use drugs.  Allergies:  Allergies  Allergen Reactions  . Aspirin Nausea Only    Sick to stomach  . Ibuprofen Nausea Only    Sick to stomach    OUTPATIENT MEDICATIONS: No current facility-administered medications on file prior to encounter.    Current Outpatient Prescriptions on File Prior to Encounter  Medication Sig Dispense Refill  . Ascorbic Acid (VITAMIN C PO) Take 1 tablet by mouth daily.    . bisoprolol-hydrochlorothiazide (ZIAC) 5-6.25 MG per tablet Take 1 tablet by mouth daily.    . celecoxib (CELEBREX) 200 MG capsule Take 200 mg by mouth 2 (two) times daily.    . cholecalciferol (VITAMIN D) 1000 UNITS tablet Take 1,000 Units by mouth daily.    Marland Kitchen levothyroxine (SYNTHROID, LEVOTHROID) 88 MCG tablet Take 88 mcg by mouth daily.     Marland Kitchen losartan (COZAAR) 100 MG tablet Take 100 mg by mouth daily.     . metFORMIN (GLUCOPHAGE-XR) 500 MG 24 hr tablet Take 500 mg by mouth daily.   3  . simvastatin (ZOCOR) 80 MG tablet Take 80 mg by mouth at bedtime.    . traMADol  (ULTRAM) 50 MG tablet Take 50 mg by mouth every 6 (six) hours as needed (for pain).     Marland Kitchen zolpidem (AMBIEN) 10 MG tablet Take 10 mg by mouth at bedtime.  3  . calcium carbonate (OS-CAL) 600 MG TABS Take 600 mg by mouth 2 (two) times daily with a meal.    . cyclobenzaprine (FLEXERIL) 10 MG tablet Take 10 mg by mouth 3 (three) times daily as needed for muscle spasms.     . Multiple Vitamin (MULTIVITAMIN) tablet Take 1 tablet by mouth daily.     CURRENT MEDICATIONS: Scheduled Meds: . [START ON 12/28/2016] cefTRIAXone (ROCEPHIN)  IV  2 g Intravenous Q24H  . enoxaparin (LOVENOX) injection  40 mg Subcutaneous Q24H  . escitalopram  5 mg Oral Daily  . feeding supplement (GLUCERNA SHAKE)  237 mL Oral BID BM  . insulin aspart  0-5 Units Subcutaneous QHS  . insulin aspart  0-9 Units Subcutaneous TID WC  . metoprolol tartrate  25 mg Oral BID  . mirtazapine  7.5 mg Oral QHS  . pantoprazole  40 mg Oral Daily  . sodium chloride flush  3 mL Intravenous Q12H   Continuous Infusions: . sodium chloride 75 mL/hr at 12/27/16 1056   PRN Meds:.acetaminophen **OR** acetaminophen, metoprolol, ondansetron **OR** ondansetron (ZOFRAN) IV, oxyCODONE   Results for orders placed or performed during the hospital encounter of 12/26/16 (from the past 48 hour(s))  Lipase, blood     Status: None   Collection Time: 12/26/16  9:02 PM  Result Value Ref Range   Lipase 40 11 - 51 U/L  Comprehensive metabolic panel     Status: Abnormal   Collection Time: 12/26/16  9:02 PM  Result Value Ref Range   Sodium 142 135 - 145 mmol/L   Potassium 4.8 3.5 - 5.1 mmol/L   Chloride 111 101 - 111 mmol/L   CO2 13 (L) 22 - 32 mmol/L   Glucose, Bld 183 (H) 65 - 99 mg/dL   BUN 36 (H) 6 - 20 mg/dL   Creatinine, Ser 0.92 0.44 - 1.00 mg/dL   Calcium 10.4 (H) 8.9 - 10.3 mg/dL   Total Protein 7.2 6.5 - 8.1 g/dL   Albumin 4.1 3.5 - 5.0 g/dL   AST 31 15 - 41 U/L   ALT  30 14 - 54 U/L   Alkaline Phosphatase 67 38 - 126 U/L   Total Bilirubin  2.0 (H) 0.3 - 1.2 mg/dL   GFR calc non Af Amer >60 >60 mL/min   GFR calc Af Amer >60 >60 mL/min    Comment: (NOTE) The eGFR has been calculated using the CKD EPI equation. This calculation has not been validated in all clinical situations. eGFR's persistently <60 mL/min signify possible Chronic Kidney Disease.    Anion gap 18 (H) 5 - 15  CBC     Status: Abnormal   Collection Time: 12/26/16  9:02 PM  Result Value Ref Range   WBC 10.8 (H) 4.0 - 10.5 K/uL   RBC 5.74 (H) 3.87 - 5.11 MIL/uL   Hemoglobin 18.2 (H) 12.0 - 15.0 g/dL   HCT 52.2 (H) 36.0 - 46.0 %   MCV 90.9 78.0 - 100.0 fL   MCH 31.7 26.0 - 34.0 pg   MCHC 34.9 30.0 - 36.0 g/dL   RDW 12.0 11.5 - 15.5 %   Platelets 284 150 - 400 K/uL  Urinalysis, Routine w reflex microscopic     Status: Abnormal   Collection Time: 12/26/16  9:15 PM  Result Value Ref Range   Color, Urine AMBER (A) YELLOW    Comment: BIOCHEMICALS MAY BE AFFECTED BY COLOR   APPearance HAZY (A) CLEAR   Specific Gravity, Urine 1.028 1.005 - 1.030   pH 5.0 5.0 - 8.0   Glucose, UA 50 (A) NEGATIVE mg/dL   Hgb urine dipstick NEGATIVE NEGATIVE   Bilirubin Urine NEGATIVE NEGATIVE   Ketones, ur 80 (A) NEGATIVE mg/dL   Protein, ur 100 (A) NEGATIVE mg/dL   Nitrite NEGATIVE NEGATIVE   Leukocytes, UA SMALL (A) NEGATIVE   RBC / HPF 0-5 0 - 5 RBC/hpf   WBC, UA 6-30 0 - 5 WBC/hpf   Bacteria, UA NONE SEEN NONE SEEN   Squamous Epithelial / LPF 0-5 (A) NONE SEEN   Mucous PRESENT    Hyaline Casts, UA PRESENT   I-stat troponin, ED     Status: None   Collection Time: 12/26/16  9:51 PM  Result Value Ref Range   Troponin i, poc 0.01 0.00 - 0.08 ng/mL   Comment 3            Comment: Due to the release kinetics of cTnI, a negative result within the first hours of the onset of symptoms does not rule out myocardial infarction with certainty. If myocardial infarction is still suspected, repeat the test at appropriate intervals.   I-Stat CG4 Lactic Acid, ED  (not at  [facility])      Status: Abnormal   Collection Time: 12/26/16 10:13 PM  Result Value Ref Range   Lactic Acid, Venous 3.98 (HH) 0.5 - 1.9 mmol/L   Comment NOTIFIED PHYSICIAN   I-Stat Venous Blood Gas, ED (order at [facility] and [facility] only)     Status: Abnormal   Collection Time: 12/26/16 10:13 PM  Result Value Ref Range   pH, Ven 7.334 7.250 - 7.430   pCO2, Ven 35.7 (L) 44.0 - 60.0 mmHg   pO2, Ven 47.0 (H) 32.0 - 45.0 mmHg   Bicarbonate 19.0 (L) 20.0 - 28.0 mmol/L   TCO2 20 0 - 100 mmol/L   O2 Saturation 81.0 %   Acid-base deficit 6.0 (H) 0.0 - 2.0 mmol/L   Patient temperature HIDE    Sample type VENOUS   Salicylate level     Status: None   Collection Time: 12/26/16  10:43 PM  Result Value Ref Range   Salicylate Lvl <5.0 2.8 - 30.0 mg/dL  Acetaminophen level     Status: Abnormal   Collection Time: 12/26/16 10:43 PM  Result Value Ref Range   Acetaminophen (Tylenol), Serum <10 (L) 10 - 30 ug/mL    Comment:        THERAPEUTIC CONCENTRATIONS VARY SIGNIFICANTLY. A RANGE OF 10-30 ug/mL MAY BE AN EFFECTIVE CONCENTRATION FOR MANY PATIENTS. HOWEVER, SOME ARE BEST TREATED AT CONCENTRATIONS OUTSIDE THIS RANGE. ACETAMINOPHEN CONCENTRATIONS >150 ug/mL AT 4 HOURS AFTER INGESTION AND >50 ug/mL AT 12 HOURS AFTER INGESTION ARE OFTEN ASSOCIATED WITH TOXIC REACTIONS.   Ethanol     Status: None   Collection Time: 12/26/16 10:43 PM  Result Value Ref Range   Alcohol, Ethyl (B) <5 <5 mg/dL    Comment:        LOWEST DETECTABLE LIMIT FOR SERUM ALCOHOL IS 5 mg/dL FOR MEDICAL PURPOSES ONLY   POC CBG, ED     Status: Abnormal   Collection Time: 12/27/16 12:19 AM  Result Value Ref Range   Glucose-Capillary 144 (H) 65 - 99 mg/dL  I-Stat CG4 Lactic Acid, ED  (not at  [facility])     Status: Abnormal   Collection Time: 12/27/16 12:54 AM  Result Value Ref Range   Lactic Acid, Venous 3.21 (HH) 0.5 - 1.9 mmol/L   Comment NOTIFIED PHYSICIAN   Glucose, capillary     Status: Abnormal   Collection Time: 12/27/16  2:28 AM    Result Value Ref Range   Glucose-Capillary 129 (H) 65 - 99 mg/dL  Lactic acid, plasma     Status: None   Collection Time: 12/27/16  4:30 AM  Result Value Ref Range   Lactic Acid, Venous 1.7 0.5 - 1.9 mmol/L  Procalcitonin     Status: None   Collection Time: 12/27/16  4:30 AM  Result Value Ref Range   Procalcitonin <0.10 ng/mL    Comment:        Interpretation: PCT (Procalcitonin) <= 0.5 ng/mL: Systemic infection (sepsis) is not likely. Local bacterial infection is possible. (NOTE)         ICU PCT Algorithm               Non ICU PCT Algorithm    ----------------------------     ------------------------------         PCT < 0.25 ng/mL                 PCT < 0.1 ng/mL     Stopping of antibiotics            Stopping of antibiotics       strongly encouraged.               strongly encouraged.    ----------------------------     ------------------------------       PCT level decrease by               PCT < 0.25 ng/mL       >= 80% from peak PCT       OR PCT 0.25 - 0.5 ng/mL          Stopping of antibiotics                                             encouraged.     Stopping of antibiotics  encouraged.    ----------------------------     ------------------------------       PCT level decrease by              PCT >= 0.25 ng/mL       < 80% from peak PCT        AND PCT >= 0.5 ng/mL            Continuin g antibiotics                                              encouraged.       Continuing antibiotics            encouraged.    ----------------------------     ------------------------------     PCT level increase compared          PCT > 0.5 ng/mL         with peak PCT AND          PCT >= 0.5 ng/mL             Escalation of antibiotics                                          strongly encouraged.      Escalation of antibiotics        strongly encouraged.   Protime-INR     Status: Abnormal   Collection Time: 12/27/16  4:30 AM  Result Value Ref Range   Prothrombin Time 15.6  (H) 11.4 - 15.2 seconds   INR 1.23   APTT     Status: None   Collection Time: 12/27/16  4:30 AM  Result Value Ref Range   aPTT 25 24 - 36 seconds  CBC     Status: Abnormal   Collection Time: 12/27/16  4:30 AM  Result Value Ref Range   WBC 14.2 (H) 4.0 - 10.5 K/uL   RBC 4.70 3.87 - 5.11 MIL/uL   Hemoglobin 14.7 12.0 - 15.0 g/dL   HCT 42.7 36.0 - 46.0 %   MCV 90.9 78.0 - 100.0 fL   MCH 31.3 26.0 - 34.0 pg   MCHC 34.4 30.0 - 36.0 g/dL   RDW 12.2 11.5 - 15.5 %   Platelets 187 150 - 400 K/uL  Creatinine, serum     Status: None   Collection Time: 12/27/16  4:30 AM  Result Value Ref Range   Creatinine, Ser 0.65 0.44 - 1.00 mg/dL   GFR calc non Af Amer >60 >60 mL/min   GFR calc Af Amer >60 >60 mL/min    Comment: (NOTE) The eGFR has been calculated using the CKD EPI equation. This calculation has not been validated in all clinical situations. eGFR's persistently <60 mL/min signify possible Chronic Kidney Disease.   TSH     Status: Abnormal   Collection Time: 12/27/16  4:30 AM  Result Value Ref Range   TSH <0.010 (L) 0.350 - 4.500 uIU/mL    Comment: Performed by a 3rd Generation assay with a functional sensitivity of <=0.01 uIU/mL.  Troponin I     Status: Abnormal   Collection Time: 12/27/16  4:30 AM  Result Value Ref Range   Troponin I 0.03 (HH) <0.03 ng/mL    Comment: CRITICAL RESULT CALLED  TO, READ BACK BY AND VERIFIED WITH: MOORE,K RN 12/27/2016 0531 JORDANS   Lactic acid, plasma     Status: None   Collection Time: 12/27/16  7:29 AM  Result Value Ref Range   Lactic Acid, Venous 1.0 0.5 - 1.9 mmol/L  Troponin I     Status: Abnormal   Collection Time: 12/27/16  7:29 AM  Result Value Ref Range   Troponin I 0.03 (HH) <0.03 ng/mL    Comment: CRITICAL VALUE NOTED.  VALUE IS CONSISTENT WITH PREVIOUSLY REPORTED AND CALLED VALUE.  Basic metabolic panel     Status: Abnormal   Collection Time: 12/27/16  7:29 AM  Result Value Ref Range   Sodium 140 135 - 145 mmol/L   Potassium  3.1 (L) 3.5 - 5.1 mmol/L   Chloride 111 101 - 111 mmol/L   CO2 18 (L) 22 - 32 mmol/L   Glucose, Bld 115 (H) 65 - 99 mg/dL   BUN 22 (H) 6 - 20 mg/dL   Creatinine, Ser 0.54 0.44 - 1.00 mg/dL   Calcium 8.8 (L) 8.9 - 10.3 mg/dL   GFR calc non Af Amer >60 >60 mL/min   GFR calc Af Amer >60 >60 mL/min    Comment: (NOTE) The eGFR has been calculated using the CKD EPI equation. This calculation has not been validated in all clinical situations. eGFR's persistently <60 mL/min signify possible Chronic Kidney Disease.    Anion gap 11 5 - 15  CBC     Status: Abnormal   Collection Time: 12/27/16  7:29 AM  Result Value Ref Range   WBC 12.9 (H) 4.0 - 10.5 K/uL   RBC 4.50 3.87 - 5.11 MIL/uL   Hemoglobin 14.1 12.0 - 15.0 g/dL   HCT 40.6 36.0 - 46.0 %   MCV 90.2 78.0 - 100.0 fL   MCH 31.3 26.0 - 34.0 pg   MCHC 34.7 30.0 - 36.0 g/dL   RDW 12.3 11.5 - 15.5 %   Platelets 189 150 - 400 K/uL  HIV antibody     Status: None   Collection Time: 12/27/16  7:29 AM  Result Value Ref Range   HIV Screen 4th Generation wRfx Non Reactive Non Reactive    Comment: (NOTE) Performed At: [facility] [address] [place], [place] [id number] Lindon Romp MD QB:[id number]   Glucose, capillary     Status: Abnormal   Collection Time: 12/27/16  8:52 AM  Result Value Ref Range   Glucose-Capillary 156 (H) 65 - 99 mg/dL  Glucose, capillary     Status: Abnormal   Collection Time: 12/27/16 11:48 AM  Result Value Ref Range   Glucose-Capillary 221 (H) 65 - 99 mg/dL  Troponin I     Status: None   Collection Time: 12/27/16  2:48 PM  Result Value Ref Range   Troponin I <0.03 <0.03 ng/mL   Dg Chest 2 View  Result Date: 12/26/2016 CLINICAL DATA:  61 y/o  F; chest pain and tachycardia. EXAM: CHEST  2 VIEW COMPARISON:  11/23/2014 chest radiograph FINDINGS: Stable normal cardiac silhouette. Aortic atherosclerosis with calcification. Clear lungs. No pleural effusion or pneumothorax. Bones are unremarkable.  Surgical clips project over right lower neck. IMPRESSION: No active cardiopulmonary disease. Electronically Signed   By: Kristine Garbe M.D.   On: 12/26/2016 22:26   Ct Abdomen Pelvis W Contrast  Result Date: 12/27/2016 CLINICAL DATA:  Acute onset of generalized abdominal pain, nausea and vomiting. Initial encounter. EXAM: CT ABDOMEN AND PELVIS WITH CONTRAST TECHNIQUE: Multidetector  CT imaging of the abdomen and pelvis was performed using the standard protocol following bolus administration of intravenous contrast. CONTRAST:  135m ISOVUE-300 IOPAMIDOL (ISOVUE-300) INJECTION 61% COMPARISON:  CT of the abdomen and pelvis from 10/21/2016 FINDINGS: Lower chest: The visualized lung bases are grossly clear. The visualized portions of the mediastinum are unremarkable. Hepatobiliary: A tiny 4 mm nonspecific hypodensity is noted at the right hepatic lobe. The liver is otherwise unremarkable in appearance. The gallbladder is grossly unremarkable. The common bile duct remains normal in caliber. Pancreas: The pancreas is within normal limits. Spleen: The spleen is unremarkable in appearance. Adrenals/Urinary Tract: The adrenal glands are unremarkable in appearance. The kidneys are within normal limits. There is no evidence of hydronephrosis. No renal or ureteral stones are identified. No perinephric stranding is seen. Stomach/Bowel: The stomach is unremarkable in appearance. The small bowel is within normal limits. The appendix is normal in caliber, without evidence of appendicitis. The colon is unremarkable in appearance. Vascular/Lymphatic: Scattered calcification is seen along the abdominal aorta and its branches. The abdominal aorta is otherwise grossly unremarkable. The inferior vena cava is grossly unremarkable. No retroperitoneal lymphadenopathy is seen. No pelvic sidewall lymphadenopathy is identified. Reproductive: The bladder is mildly distended and grossly unremarkable. The patient is status post  hysterectomy. No suspicious adnexal masses are seen. Other: No additional soft tissue abnormalities are seen. Musculoskeletal: No acute osseous abnormalities are identified. The visualized musculature is unremarkable in appearance. IMPRESSION: 1. No acute abnormality seen within the abdomen or pelvis. 2. Scattered aortic atherosclerosis. 3. Tiny 4 mm nonspecific hypodensity at the right hepatic lobe. This is likely benign, and is grossly unchanged from February. Electronically Signed   By: JGarald BaldingM.D.   On: 12/27/2016 00:01    ROS: General:no colds or fevers, + weight loss from 165 to 122 lbs over 10 months. Skin:no rashes or ulcers HEENT:no blurred vision, no congestion CV:see HPI PUL:see HPI GQQ:IWLNdiarrhea today no constipation or melena, no indigestion GU:no hematuria, some dysuria MS:no joint pain, no claudication Neuro:no syncope, no lightheadedness Endo:no diabetes, + thyroid disease   Blood pressure (!) 150/75, pulse 95, temperature 98.2 F (36.8 C), temperature source Oral, resp. rate 16, height 5' 7"  (1.702 m), weight 122 lb (55.3 kg), SpO2 97 %.  Wt Readings from Last 3 Encounters:  12/27/16 122 lb (55.3 kg)  03/10/16 165 lb (74.8 kg)  03/06/15 163 lb (73.9 kg)    PE: General:Pleasant affect, NAD Skin:Warm and dry, brisk capillary refill HEENT:normocephalic, sclera clear, mucus membranes moist Neck:supple, no JVD, no bruits  Heart:S1S2 RRR without murmur, gallup, rub or click Lungs:clear without rales, rhonchi, or wheezes ALGX:QJJH non tender, + BS, do not palpate liver spleen or masses Ext:no lower ext edema, 2+ pedal pulses, 2+ radial pulses Neuro:alert and oriented X 3, MAE, follows commands, + facial symmetry    Assessment/Plan Principal Problem:   Dysthymic disorder Active Problems:   Hypercholesterolemia   Back pain, chronic   Depression with suicidal ideation   Sepsis (HCC)   Hypertension   Thyroid disease   1.  SVT - symptomatic.  She says  that she could feel her heart racing on admission and has noted elevated HRs at home.   - will check 2D echo but most likely related to hyperthyroidism- now off synthroid.  - - - - Agree with BB but will change to toprol XL 25 mg daily for better compliance  2. Mildly elevated troponin with no chest pain - most likely due to tachycardia.  3. Sepsis- antibiotics per IM  4. HTN - BP currently controlled   Cecilie Kicks  Nurse Practitioner Certified [facility] Pager [phone] or after 5pm or weekends call [phone] 12/27/2016, 4:42 PM   Patient seen and independently examined with Cecilie Kicks, NP. We discussed all aspects of the encounter. I agree with the assessment and plan as stated above with minor changes.  Patient has noticed palpitations intermittently at home.  EKG appears to be sinus tachycardia at 156bpm likely due to hyperthyroidism with markedly suppressed TSH.  Will start Toprol XL 2m daily and titrate as needed to control HR.  Troponin minimally elevated and c/w demand ischemia in presence of tachycardia.  Will check 2D echo to make sure LVF is normal.  Signed: TFransico Him MD [facility] 12/27/2016

## 2016-12-27 NOTE — Progress Notes (Signed)
Pharmacy Antibiotic Consult Note  Carrie Macias is a 61 y.o. female admitted on 12/26/2016 with sepsis and UTI.  Pharmacy has been consulted for Rocephin dosing.  Plan: Rocephin 2g IV Q24H.  Height: 5\' 7"  (170.2 cm) Weight: 160 lb (72.6 kg) IBW/kg (Calculated) : 61.6  Temp (24hrs), Avg:98.2 F (36.8 C), Min:98.2 F (36.8 C), Max:98.2 F (36.8 C)   Recent Labs Lab 12/26/16 2102 12/26/16 2213 12/27/16 0054  WBC 10.8*  --   --   CREATININE 0.92  --   --   LATICACIDVEN  --  3.98* 3.21*    Estimated Creatinine Clearance: 62.4 mL/min (by C-G formula based on SCr of 0.92 mg/dL).    Allergies  Allergen Reactions  . Aspirin Nausea Only    Sick to stomach  . Ibuprofen Nausea Only    Sick to stomach    Thank you for allowing pharmacy to be a part of this patient's care.  Wynona Neat, PharmD, BCPS  12/27/2016 1:34 AM

## 2016-12-27 NOTE — Progress Notes (Signed)
Dr. Charlies Silvers in room spoke with her reference to suicide precautions due to past attempt in last 30 days and had been released from facility.  Dr. Charlies Silvers to follow-up with possible suicide sitter and psych consult.

## 2016-12-27 NOTE — Progress Notes (Signed)
At Approx 1040am called to patients room to assist with bathroom. Noted that patient had soiled bed. Assisted patient to ambulate to restroom. Patient assisted with peri care, gown change and bed pad change.   Notified around 1100 by Kizzie Ide, RN for patient that patient had an order for Suicide Sitter.   Informed patient that the MD had initiated suicide precautions, including a suicide sitter. Informed patient that her belongings would have to be searched by security to ensure that there were no harmful items found. Also informed patient that the room would be checked and prepared for her safety.  Patient verbalized understanding, and stated that she "wished she had not come to the hospital last night".   Noted patient sister in room with a hospital armband on, stated that she was discharged around 4am this morning. Patient stated that she was waiting on her ride to pick her sister up.   Per Pearletha Furl, Security Patient clothes, and purse brought to the desk . Patient sister Wayland Denis took possession of patient pink wallet which included savings register, and other important cards/documents. Also took possession of $3.37 which was located in a "sock wallet".   Patient had 3 visitors who arrived to visit patient. Visitors were wanded by security and allowed to visit 2 people at a time.   Carisha Kantor, Bryn Gulling

## 2016-12-27 NOTE — Progress Notes (Signed)
New Admission Note: 12/27/16  Arrival Method: stretcher Mental Orientation:alert and oriented x 4 Telemetry: 1R71 NSR, 2nd verification completed Assessment: completed Skin: 2 RNs checked. Small nickel size wound to left breast   IV: RAC, LAC NS @ 125/hr Pain:  10/10 generalized body aches Safety Measures: Bed in low position, bed alarm on, call light within reach.   Admission: Completed [facility] [place] Orientation: Patient has been orientated to the room, unit and staff.  Family: none at bedside upon arrival to 6E23  Pt comes in with c/o nausea, vomiting, unable to eat or keep anything down. Alert and oriented x4. Wears bilateral hearing aids. Pt also admits to attempting suicide within the last 30 days. "I tried to slice my wrist and I tried to take my sleeping pills but nothing would work." Currently patient is denying any suicidal or homicidal ideations. States, "I have to keep pushing forward."

## 2016-12-27 NOTE — ED Notes (Signed)
Dr Dina Rich given a copy of lactic acid results 3.21

## 2016-12-27 NOTE — H&P (Signed)
History and Physical    Carrie Macias:[id number] DOB: January 03, 1956 DOA: 12/26/2016  PCP: Shirline Frees, MD  Patient coming from: Home  Chief Complaint: Nausea, decreased PO intake.   HPI: Carrie Macias is a 61 y.o. female with medical history significant of thyroid cancer s/p surgery and subsequent hypothyroidism, GERD, HLD, HTN, DM2, Depression and anxiety, chronic back pain who presents for nausea, decreased PO intake for about 1 week.  Also reports dysuria for 1-2 weeks.  She has a very depressed affect, so exact timeline was difficult to elicit.  She notes that she has bad reflux with anything she eats.  Basically everything she eats gives her heartburn or gas and so she stopped eating because she didn't see the point.  She has not taken her medications since Tuesday.  She notes increased nausea but no vomiting.  She has been having dysuria with lower abdominal pain.  She has maybe been feverish, but has not checked her temperature.  She has had some chest pain off and on for a couple of days, seems to be burning in nature.  She has also been thirsty, but has not noticed an increase in her urination.  She has generalized weakness and an area of ulceration on her left breast which she just noticed this morning.  She reports that she feels hungry, just doesn't see the point in eating.   Carrie Macias was last in the ED for SI and was placed in an inpatient psych facility.  She reports that she did not think it helped her.  She is not currently reported to be on any psych medications and she has not been taking her medications for 1 week.  She further notes that she doesn't sleep at all and that she is taking care of her sister who has similar issues with depression and pain.    ED Course: In the ED, she was found to be acidotic with pH of 7.33, Bicarb of 13.  She had a mildly elevated glucose and an AG of 18.  She had a lactic acid around 4.  Her WBC was 10.8, but in the setting of high hgb, likely  related to dehydration.  BUN was 36.  CT abdomen and CXR did not show any acute source of infection.    Review of Systems: As per HPI otherwise 10 point review of systems negative.   Past Medical History:  Diagnosis Date  . Allergy   . Anxiety   . Asthma   . Back pain, chronic   . Cancer (HCC)    THYROID  . Depression   . Detrusor dyssynergia   . Diabetes mellitus    TYPE II  . GERD (gastroesophageal reflux disease)   . Heart murmur   . Hormone disorder    HYPOTHYROIDISM  . Hypercholesterolemia   . Hypertension   . Thyroid disease     Past Surgical History:  Procedure Laterality Date  . ABDOMINAL HYSTERECTOMY     LAVH/RSO/SLING/ANT/COLPO  . COLONOSCOPY  07/16/2010  . EXTERNAL EAR SURGERY    . INNER EAR SURGERY     IMPLANT  . TONSILLECTOMY AND ADENOIDECTOMY    . TUBAL LIGATION     Reviewed with patient.   reports that she has never smoked. She has never used smokeless tobacco. She reports that she drinks alcohol. She reports that she does not use drugs.  Allergies  Allergen Reactions  . Aspirin Nausea Only    Sick to stomach  . Ibuprofen  Nausea Only    Sick to stomach   Reviewed, multiple family members with psych history.  Family History  Problem Relation Age of Onset  . Diabetes Mother   . Hypertension Mother   . Heart disease Mother   . Fibromyalgia Mother   . Congestive Heart Failure Mother   . Hypertension Father   . Congestive Heart Failure Father   . Hypertension Sister   . COPD Sister   . OCD Sister   . Fibromyalgia Sister   . Bipolar disorder Sister   . Bipolar disorder Brother   . Bipolar disorder Sister   . Bipolar disorder Maternal Grandfather    She is not taking any medications.  Prior to Admission medications   Medication Sig Start Date End Date Taking? Authorizing Provider  Ascorbic Acid (VITAMIN C PO) Take 1 tablet by mouth daily.   Yes Historical Provider, MD  bisoprolol-hydrochlorothiazide (ZIAC) 5-6.25 MG per tablet Take 1 tablet  by mouth daily.   Yes Historical Provider, MD  celecoxib (CELEBREX) 200 MG capsule Take 200 mg by mouth 2 (two) times daily.   Yes Historical Provider, MD  cholecalciferol (VITAMIN D) 1000 UNITS tablet Take 1,000 Units by mouth daily.   Yes Historical Provider, MD  levothyroxine (SYNTHROID, LEVOTHROID) 88 MCG tablet Take 88 mcg by mouth daily.    Yes Historical Provider, MD  losartan (COZAAR) 100 MG tablet Take 100 mg by mouth daily.    Yes Historical Provider, MD  metFORMIN (GLUCOPHAGE-XR) 500 MG 24 hr tablet Take 500 mg by mouth daily.  11/19/16  Yes Historical Provider, MD  simvastatin (ZOCOR) 80 MG tablet Take 80 mg by mouth at bedtime.   Yes Historical Provider, MD  traMADol (ULTRAM) 50 MG tablet Take 50 mg by mouth every 6 (six) hours as needed (for pain).    Yes Historical Provider, MD  zolpidem (AMBIEN) 10 MG tablet Take 10 mg by mouth at bedtime. 11/19/16  Yes Historical Provider, MD  calcium carbonate (OS-CAL) 600 MG TABS Take 600 mg by mouth 2 (two) times daily with a meal.    Historical Provider, MD  cyclobenzaprine (FLEXERIL) 10 MG tablet Take 10 mg by mouth 3 (three) times daily as needed for muscle spasms.     Historical Provider, MD  Multiple Vitamin (MULTIVITAMIN) tablet Take 1 tablet by mouth daily.    Historical Provider, MD    Physical Exam: Vitals:   12/26/16 2301 12/26/16 2345 12/27/16 0000 12/27/16 0030  BP:  139/66 (!) 141/77 128/83  Pulse:  98 (!) 101 (!) 113  Resp:  12 14 14   Temp:      TempSrc:      SpO2:  100% 100% 100%  Weight: 160 lb (72.6 kg)     Height: 5\' 7"  (1.702 m)       Constitutional: NAD, calm, comfortable Vitals:   12/26/16 2301 12/26/16 2345 12/27/16 0000 12/27/16 0030  BP:  139/66 (!) 141/77 128/83  Pulse:  98 (!) 101 (!) 113  Resp:  12 14 14   Temp:      TempSrc:      SpO2:  100% 100% 100%  Weight: 160 lb (72.6 kg)     Height: 5\' 7"  (1.702 m)      Eyes:  lids and conjunctivae normal ENMT: Mucous membranes are dry. Normal dentition.  Neck:  normal, supple, no lymphadenopathy Respiratory: CTAB, no wheezing, no crackles. Normal respiratory effort.  Cardiovascular: Tachycardic rate and regular rhythm, no murmurs / rubs / gallops. No extremity  edema. 2+ pedal pulses.  Abdomen: mild ttp suprapubically, ND, +BS Musculoskeletal: no clubbing / cyanosis. Normal muscle tone.  Skin: 0.5X0.5 de-roofed pustule with surrounding erythema in the 5 O'clock position of left breast.  No satellite lesions or other areas that look like zoster.  Otherwise, no acute findings.  Neurologic: Moving all extremities, no focal deficit Psychiatric: Very depressed affect, at times tearful, expressing hopelessness in her situation.   Labs on Admission: I have personally reviewed following labs and imaging studies  CBC:  Recent Labs Lab 12/26/16 2102  WBC 10.8*  HGB 18.2*  HCT 52.2*  MCV 90.9  PLT 381   Basic Metabolic Panel:  Recent Labs Lab 12/26/16 2102  NA 142  K 4.8  CL 111  CO2 13*  GLUCOSE 183*  BUN 36*  CREATININE 0.92  CALCIUM 10.4*   GFR: Estimated Creatinine Clearance: 62.4 mL/min (by C-G formula based on SCr of 0.92 mg/dL). Liver Function Tests:  Recent Labs Lab 12/26/16 2102  AST 31  ALT 30  ALKPHOS 67  BILITOT 2.0*  PROT 7.2  ALBUMIN 4.1    Recent Labs Lab 12/26/16 2102  LIPASE 40   No results for input(s): AMMONIA in the last 168 hours. Coagulation Profile: No results for input(s): INR, PROTIME in the last 168 hours. Cardiac Enzymes: No results for input(s): CKTOTAL, CKMB, CKMBINDEX, TROPONINI in the last 168 hours. BNP (last 3 results) No results for input(s): PROBNP in the last 8760 hours. HbA1C: No results for input(s): HGBA1C in the last 72 hours. CBG:  Recent Labs Lab 12/27/16 0019  GLUCAP 144*   Lipid Profile: No results for input(s): CHOL, HDL, LDLCALC, TRIG, CHOLHDL, LDLDIRECT in the last 72 hours. Thyroid Function Tests: No results for input(s): TSH, T4TOTAL, FREET4, T3FREE, THYROIDAB in  the last 72 hours. Anemia Panel: No results for input(s): VITAMINB12, FOLATE, FERRITIN, TIBC, IRON, RETICCTPCT in the last 72 hours. Urine analysis:    Component Value Date/Time   COLORURINE AMBER (A) 12/26/2016 2115   APPEARANCEUR HAZY (A) 12/26/2016 2115   LABSPEC 1.028 12/26/2016 2115   PHURINE 5.0 12/26/2016 2115   GLUCOSEU 50 (A) 12/26/2016 2115   HGBUR NEGATIVE 12/26/2016 2115   BILIRUBINUR NEGATIVE 12/26/2016 2115   KETONESUR 80 (A) 12/26/2016 2115   PROTEINUR 100 (A) 12/26/2016 2115   UROBILINOGEN 0.2 02/01/2013 1322   NITRITE NEGATIVE 12/26/2016 2115   LEUKOCYTESUR SMALL (A) 12/26/2016 2115    Radiological Exams on Admission: Dg Chest 2 View  Result Date: 12/26/2016 CLINICAL DATA:  61 y/o  F; chest pain and tachycardia. EXAM: CHEST  2 VIEW COMPARISON:  11/23/2014 chest radiograph FINDINGS: Stable normal cardiac silhouette. Aortic atherosclerosis with calcification. Clear lungs. No pleural effusion or pneumothorax. Bones are unremarkable. Surgical clips project over right lower neck. IMPRESSION: No active cardiopulmonary disease. Electronically Signed   By: Kristine Garbe M.D.   On: 12/26/2016 22:26   Ct Abdomen Pelvis W Contrast  Result Date: 12/27/2016 CLINICAL DATA:  Acute onset of generalized abdominal pain, nausea and vomiting. Initial encounter. EXAM: CT ABDOMEN AND PELVIS WITH CONTRAST TECHNIQUE: Multidetector CT imaging of the abdomen and pelvis was performed using the standard protocol following bolus administration of intravenous contrast. CONTRAST:  171mL ISOVUE-300 IOPAMIDOL (ISOVUE-300) INJECTION 61% COMPARISON:  CT of the abdomen and pelvis from 10/21/2016 FINDINGS: Lower chest: The visualized lung bases are grossly clear. The visualized portions of the mediastinum are unremarkable. Hepatobiliary: A tiny 4 mm nonspecific hypodensity is noted at the right hepatic lobe. The liver is  otherwise unremarkable in appearance. The gallbladder is grossly unremarkable.  The common bile duct remains normal in caliber. Pancreas: The pancreas is within normal limits. Spleen: The spleen is unremarkable in appearance. Adrenals/Urinary Tract: The adrenal glands are unremarkable in appearance. The kidneys are within normal limits. There is no evidence of hydronephrosis. No renal or ureteral stones are identified. No perinephric stranding is seen. Stomach/Bowel: The stomach is unremarkable in appearance. The small bowel is within normal limits. The appendix is normal in caliber, without evidence of appendicitis. The colon is unremarkable in appearance. Vascular/Lymphatic: Scattered calcification is seen along the abdominal aorta and its branches. The abdominal aorta is otherwise grossly unremarkable. The inferior vena cava is grossly unremarkable. No retroperitoneal lymphadenopathy is seen. No pelvic sidewall lymphadenopathy is identified. Reproductive: The bladder is mildly distended and grossly unremarkable. The patient is status post hysterectomy. No suspicious adnexal masses are seen. Other: No additional soft tissue abnormalities are seen. Musculoskeletal: No acute osseous abnormalities are identified. The visualized musculature is unremarkable in appearance. IMPRESSION: 1. No acute abnormality seen within the abdomen or pelvis. 2. Scattered aortic atherosclerosis. 3. Tiny 4 mm nonspecific hypodensity at the right hepatic lobe. This is likely benign, and is grossly unchanged from February. Electronically Signed   By: Garald Balding M.D.   On: 12/27/2016 00:01    EKG: Independently reviewed. Sinus tachycardia to the 150s  Assessment/Plan Sepsis with dehydration  - Elevated HR on admission, BP has remained stable, she has not had an elevated temperature. - Lactate is high - HR improved with fluids - She may have a UTI, I changed Abx to Rocephin - BC and UC pending - Trend lactate, bicarb - Telemetry - Repeat EKG  - Repeat CBC in the AM - IVF with NS at 125cc/hr  overnight, reassess in the AM - Carb mod diet - Zofran for nausea - Monitor for fever  Chest pain, GERD - Atypical in nature, trend troponin - Repeat EKG - Telemetry - Start protonix  DM2 - She is supposed to be on metformin, but has not been taking.  Given lactic acidosis, will hold this medication - SSI  - Given her persistent nausea with eating, consider gastric emptying study  Severe Depression  - I think a majority of her symptoms of not eating and decreased fluid intake can be explained by lack of motivation and hopelessness related to this.  I do not see any antidepressants or mood stabilizers that she is on currently.  And she certainly has not taken medication recently.  Would call pharmacy in the AM to confirm her home medications - Would have formal Psych consult in the AM for pharmacotherapy - Encourage PO intake - Suicide precautions    Back pain, chronic - Pain control with tylenol or oxycodone    Hypertension - BP normotensive - Holding oral medications for now in the setting of presumed sepsis - Restart home meds if BP worsens    Hypothyroidism - At last check, TSH was low indicating dose of synthroid likely too high - Check TSH, consider decreasing dose   DVT prophylaxis: Lovenox Code Status: Full Disposition Plan: D/C in 2-3 days Consults called: None Admission status: Admit, telemetry   Gilles Chiquito MD [facility] Pager [phone]  If 7PM-7AM, please contact night-coverage [email or web address] Password [email or web address]  12/27/2016, 12:48 AM

## 2016-12-27 NOTE — ED Provider Notes (Signed)
[facility] DEPT Provider Note   CSN: [id number] Arrival date & time: 12/26/16  2041     History   Chief Complaint Chief Complaint  Patient presents with  . Emesis  . Dysuria  . Tachycardia    HPI Carrie Macias is a 61 y.o. female.  HPI 61 yo F with PMHx below here with emesis, nausea, dysuria. Pt states her sx started approx 4 days ago with dysuria, abdominal pain, nausea, and vomiting. Since then, she has had worsening general fatigue, nausea, and has been vomiting uncontrollably. She has been unable to eat or drink. She feels generally fatigued and endorses lightheadedness with sitting up and any movement. No HA. She does have recent h/o hospitalization for SI and admits to poor appetite with this. Denies any intentional self harm or suicide attempt and lives with sister who has been watching out for her in the home. No known recent sick contacts.   Past Medical History:  Diagnosis Date  . Allergy   . Anxiety   . Asthma   . Back pain, chronic   . Cancer (HCC)    THYROID  . Depression   . Detrusor dyssynergia   . Diabetes mellitus    TYPE II  . GERD (gastroesophageal reflux disease)   . Heart murmur   . Hormone disorder    HYPOTHYROIDISM  . Hypercholesterolemia   . Hypertension   . Thyroid disease     Patient Active Problem List   Diagnosis Date Noted  . Sepsis ([facility]) 12/27/2016  . Hypertension   . Thyroid disease   . Depression with suicidal ideation 12/13/2016  . Female cystocele 02/07/2014  . History of vitamin D deficiency 01/27/2012  . Hypercholesterolemia   . Back pain, chronic     Past Surgical History:  Procedure Laterality Date  . ABDOMINAL HYSTERECTOMY     LAVH/RSO/SLING/ANT/COLPO  . COLONOSCOPY  07/16/2010  . EXTERNAL EAR SURGERY    . INNER EAR SURGERY     IMPLANT  . TONSILLECTOMY AND ADENOIDECTOMY    . TUBAL LIGATION      OB History    Gravida Para Term Preterm AB Living   3 2 2   1 2    SAB TAB Ectopic Multiple Live Births   1        2       Home Medications    Prior to Admission medications   Medication Sig Start Date End Date Taking? Authorizing Provider  Ascorbic Acid (VITAMIN C PO) Take 1 tablet by mouth daily.   Yes Historical Provider, MD  bisoprolol-hydrochlorothiazide (ZIAC) 5-6.25 MG per tablet Take 1 tablet by mouth daily.   Yes Historical Provider, MD  celecoxib (CELEBREX) 200 MG capsule Take 200 mg by mouth 2 (two) times daily.   Yes Historical Provider, MD  cholecalciferol (VITAMIN D) 1000 UNITS tablet Take 1,000 Units by mouth daily.   Yes Historical Provider, MD  levothyroxine (SYNTHROID, LEVOTHROID) 88 MCG tablet Take 88 mcg by mouth daily.    Yes Historical Provider, MD  losartan (COZAAR) 100 MG tablet Take 100 mg by mouth daily.    Yes Historical Provider, MD  metFORMIN (GLUCOPHAGE-XR) 500 MG 24 hr tablet Take 500 mg by mouth daily.  11/19/16  Yes Historical Provider, MD  simvastatin (ZOCOR) 80 MG tablet Take 80 mg by mouth at bedtime.   Yes Historical Provider, MD  traMADol (ULTRAM) 50 MG tablet Take 50 mg by mouth every 6 (six) hours as needed (for pain).  Yes Historical Provider, MD  zolpidem (AMBIEN) 10 MG tablet Take 10 mg by mouth at bedtime. 11/19/16  Yes Historical Provider, MD  calcium carbonate (OS-CAL) 600 MG TABS Take 600 mg by mouth 2 (two) times daily with a meal.    Historical Provider, MD  cyclobenzaprine (FLEXERIL) 10 MG tablet Take 10 mg by mouth 3 (three) times daily as needed for muscle spasms.     Historical Provider, MD  Multiple Vitamin (MULTIVITAMIN) tablet Take 1 tablet by mouth daily.    Historical Provider, MD    Family History Family History  Problem Relation Age of Onset  . Diabetes Mother   . Hypertension Mother   . Heart disease Mother   . Fibromyalgia Mother   . Congestive Heart Failure Mother   . Hypertension Father   . Congestive Heart Failure Father   . Hypertension Sister   . COPD Sister   . OCD Sister   . Fibromyalgia Sister   . Bipolar disorder Sister    . Bipolar disorder Brother   . Bipolar disorder Sister   . Bipolar disorder Maternal Grandfather     Social History Social History  Substance Use Topics  . Smoking status: Never Smoker  . Smokeless tobacco: Never Used  . Alcohol use 0.0 oz/week     Comment: SELDOM     Allergies   Aspirin and Ibuprofen   Review of Systems Review of Systems  Constitutional: Positive for chills and fatigue. Negative for fever.  HENT: Negative for congestion, rhinorrhea and sore throat.   Eyes: Negative for visual disturbance.  Respiratory: Negative for cough, shortness of breath and wheezing.   Cardiovascular: Negative for chest pain and leg swelling.  Gastrointestinal: Positive for abdominal pain, nausea and vomiting. Negative for diarrhea.  Endocrine: Positive for polydipsia.  Genitourinary: Negative for dysuria, flank pain, vaginal bleeding and vaginal discharge.  Musculoskeletal: Negative for neck pain.  Skin: Negative for rash.  Allergic/Immunologic: Negative for immunocompromised state.  Neurological: Positive for weakness and light-headedness. Negative for syncope and headaches.  Hematological: Does not bruise/bleed easily.  All other systems reviewed and are negative.    Physical Exam Updated Vital Signs BP 117/62 (BP Location: Right Arm)   Pulse (!) 101   Temp 97.9 F (36.6 C) (Oral)   Resp 14   Ht 5\' 7"  (1.702 m)   Wt 122 lb (55.3 kg)   SpO2 100%   BMI 19.11 kg/m   Physical Exam  Constitutional: She is oriented to person, place, and time. She appears well-developed and well-nourished. She appears distressed.  HENT:  Head: Normocephalic and atraumatic.  Markedly dry, tacky mucous membranes  Eyes: Conjunctivae are normal. Pupils are equal, round, and reactive to light.  Neck: Neck supple.  Cardiovascular: Regular rhythm.  Tachycardia present.  Exam reveals no friction rub.   No murmur heard. Pulmonary/Chest: Effort normal and breath sounds normal. No respiratory  distress. She has no wheezes. She has no rales.  Abdominal: Soft. She exhibits no distension. There is tenderness (mild, generalized). There is no rebound and no guarding.  Musculoskeletal: She exhibits no edema.  Neurological: She is alert and oriented to person, place, and time. No sensory deficit.  Skin: Skin is warm. Capillary refill takes 2 to 3 seconds.  Nursing note and vitals reviewed.    ED Treatments / Results  Labs (all labs ordered are listed, but only abnormal results are displayed) Labs Reviewed  COMPREHENSIVE METABOLIC PANEL - Abnormal; Notable for the following:  Result Value   CO2 13 (*)    Glucose, Bld 183 (*)    BUN 36 (*)    Calcium 10.4 (*)    Total Bilirubin 2.0 (*)    Anion gap 18 (*)    All other components within normal limits  CBC - Abnormal; Notable for the following:    WBC 10.8 (*)    RBC 5.74 (*)    Hemoglobin 18.2 (*)    HCT 52.2 (*)    All other components within normal limits  URINALYSIS, ROUTINE W REFLEX MICROSCOPIC - Abnormal; Notable for the following:    Color, Urine AMBER (*)    APPearance HAZY (*)    Glucose, UA 50 (*)    Ketones, ur 80 (*)    Protein, ur 100 (*)    Leukocytes, UA SMALL (*)    Squamous Epithelial / LPF 0-5 (*)    All other components within normal limits  ACETAMINOPHEN LEVEL - Abnormal; Notable for the following:    Acetaminophen (Tylenol), Serum <10 (*)    All other components within normal limits  I-STAT CG4 LACTIC ACID, ED - Abnormal; Notable for the following:    Lactic Acid, Venous 3.98 (*)    All other components within normal limits  I-STAT VENOUS BLOOD GAS, ED - Abnormal; Notable for the following:    pCO2, Ven 35.7 (*)    pO2, Ven 47.0 (*)    Bicarbonate 19.0 (*)    Acid-base deficit 6.0 (*)    All other components within normal limits  CBG MONITORING, ED - Abnormal; Notable for the following:    Glucose-Capillary 144 (*)    All other components within normal limits  I-STAT CG4 LACTIC ACID, ED -  Abnormal; Notable for the following:    Lactic Acid, Venous 3.21 (*)    All other components within normal limits  CULTURE, BLOOD (ROUTINE X 2)  CULTURE, BLOOD (ROUTINE X 2)  URINE CULTURE  LIPASE, BLOOD  SALICYLATE LEVEL  ETHANOL  LACTIC ACID, PLASMA  LACTIC ACID, PLASMA  PROCALCITONIN  PROTIME-INR  APTT  HIV ANTIBODY (ROUTINE TESTING)  CBC  CREATININE, SERUM  TSH  TROPONIN I  TROPONIN I  TROPONIN I  BASIC METABOLIC PANEL  CBC  HEMOGLOBIN A1C  I-STAT TROPOININ, ED    EKG  EKG Interpretation  Date/Time:  Sunday December 26 2016 21:06:35 EDT Ventricular Rate:  156 PR Interval:  116 QRS Duration: 78 QT Interval:  260 QTC Calculation: 419 R Axis:   -65 Text Interpretation:  Sinus tachycardia Left axis deviation Left ventricular hypertrophy with repolarization abnormality Inferior infarct , age undetermined Anterolateral infarct , age undetermined Abnormal ECG Since last EKG Marked increase in heart rate Non-specific ST changes likely rate related Confirmed by Ellender Hose MD, Lysbeth Galas [phone]) on 12/27/2016 3:25:16 AM       Radiology Dg Chest 2 View  Result Date: 12/26/2016 CLINICAL DATA:  61 y/o  F; chest pain and tachycardia. EXAM: CHEST  2 VIEW COMPARISON:  11/23/2014 chest radiograph FINDINGS: Stable normal cardiac silhouette. Aortic atherosclerosis with calcification. Clear lungs. No pleural effusion or pneumothorax. Bones are unremarkable. Surgical clips project over right lower neck. IMPRESSION: No active cardiopulmonary disease. Electronically Signed   By: Kristine Garbe M.D.   On: 12/26/2016 22:26   Ct Abdomen Pelvis W Contrast  Result Date: 12/27/2016 CLINICAL DATA:  Acute onset of generalized abdominal pain, nausea and vomiting. Initial encounter. EXAM: CT ABDOMEN AND PELVIS WITH CONTRAST TECHNIQUE: Multidetector CT imaging of the abdomen and  pelvis was performed using the standard protocol following bolus administration of intravenous contrast. CONTRAST:  11mL  ISOVUE-300 IOPAMIDOL (ISOVUE-300) INJECTION 61% COMPARISON:  CT of the abdomen and pelvis from 10/21/2016 FINDINGS: Lower chest: The visualized lung bases are grossly clear. The visualized portions of the mediastinum are unremarkable. Hepatobiliary: A tiny 4 mm nonspecific hypodensity is noted at the right hepatic lobe. The liver is otherwise unremarkable in appearance. The gallbladder is grossly unremarkable. The common bile duct remains normal in caliber. Pancreas: The pancreas is within normal limits. Spleen: The spleen is unremarkable in appearance. Adrenals/Urinary Tract: The adrenal glands are unremarkable in appearance. The kidneys are within normal limits. There is no evidence of hydronephrosis. No renal or ureteral stones are identified. No perinephric stranding is seen. Stomach/Bowel: The stomach is unremarkable in appearance. The small bowel is within normal limits. The appendix is normal in caliber, without evidence of appendicitis. The colon is unremarkable in appearance. Vascular/Lymphatic: Scattered calcification is seen along the abdominal aorta and its branches. The abdominal aorta is otherwise grossly unremarkable. The inferior vena cava is grossly unremarkable. No retroperitoneal lymphadenopathy is seen. No pelvic sidewall lymphadenopathy is identified. Reproductive: The bladder is mildly distended and grossly unremarkable. The patient is status post hysterectomy. No suspicious adnexal masses are seen. Other: No additional soft tissue abnormalities are seen. Musculoskeletal: No acute osseous abnormalities are identified. The visualized musculature is unremarkable in appearance. IMPRESSION: 1. No acute abnormality seen within the abdomen or pelvis. 2. Scattered aortic atherosclerosis. 3. Tiny 4 mm nonspecific hypodensity at the right hepatic lobe. This is likely benign, and is grossly unchanged from February. Electronically Signed   By: Garald Balding M.D.   On: 12/27/2016 00:01     Procedures Procedures (including critical care time)  CRITICAL CARE Performed by: Evonnie Pat   Total critical care time: 35 minutes  Critical care time was exclusive of separately billable procedures and treating other patients.  Critical care was necessary to treat or prevent imminent or life-threatening deterioration.  Critical care was time spent personally by me on the following activities: development of treatment plan with patient and/or surrogate as well as nursing, discussions with consultants, evaluation of patient's response to treatment, examination of patient, obtaining history from patient or surrogate, ordering and performing treatments and interventions, ordering and review of laboratory studies, ordering and review of radiographic studies, pulse oximetry and re-evaluation of patient's condition.    Medications Ordered in ED Medications  enoxaparin (LOVENOX) injection 40 mg (not administered)  sodium chloride flush (NS) 0.9 % injection 3 mL (not administered)  0.9 %  sodium chloride infusion (not administered)  acetaminophen (TYLENOL) tablet 650 mg (not administered)    Or  acetaminophen (TYLENOL) suppository 650 mg (not administered)  oxyCODONE (Oxy IR/ROXICODONE) immediate release tablet 5 mg (not administered)  ondansetron (ZOFRAN) tablet 4 mg (not administered)    Or  ondansetron (ZOFRAN) injection 4 mg (not administered)  insulin aspart (novoLOG) injection 0-9 Units (not administered)  insulin aspart (novoLOG) injection 0-5 Units (not administered)  pantoprazole (PROTONIX) EC tablet 40 mg (not administered)  cefTRIAXone (ROCEPHIN) 2 g in dextrose 5 % 50 mL IVPB (not administered)  sodium chloride 0.9 % bolus 1,000 mL (0 mLs Intravenous Stopped 12/27/16 0015)  sodium chloride 0.9 % bolus 1,000 mL (0 mLs Intravenous Stopped 12/27/16 0015)  sodium chloride 0.9 % bolus 1,000 mL (0 mLs Intravenous Stopped 12/27/16 0015)  piperacillin-tazobactam (ZOSYN) IVPB  3.375 g (0 g Intravenous Stopped 12/26/16 2308)  iopamidol (ISOVUE-300) 61 %  injection (100 mLs  Contrast Given 12/26/16 2318)  cefTRIAXone (ROCEPHIN) 2 g in dextrose 5 % 50 mL IVPB (2 g Intravenous New Bag/Given 12/27/16 0200)     Initial Impression / Assessment and Plan / ED Course  I have reviewed the triage vital signs and the nursing notes.  Pertinent labs & imaging results that were available during my care of the patient were reviewed by me and considered in my medical decision making (see chart for details).   61 yo F with PMHx of HTN, depression here with nausea/vomiting and dysuria. On arrival, pt markedly tachycardic and hypovolemic. Initial DDx includes sepsis, DKA and pt is afebrile, without significant signs of infection at this time. Labs, fluids ordered.   Labs returned. Suspect sepsis 2/2 UTI with significant dehydration, although there is also likely a component of poor PO intake 2/2 depression. Initial labs show significant lactic acidosis, acidemia - code sepsis initiated. Pt does have AG acidosis but this is explained by her LA, and glucose <200 - suspect dehydration more so than DKA and pt is on metformin only. Will give fluids, ABX.  Pt markedly improved s/p IVF. UA does show possible UTI so ABX given, though suspect this is more so 2/2 dehydration. Will admit to medicine. On repeat clinical exam, pt HR now low 100s, mucous membranes are moist and cap refill is now <2 seconds - she has responded well to IVF.  Final Clinical Impressions(s) / ED Diagnoses   Final diagnoses:  Sepsis, due to unspecified organism [facility])  Lower urinary tract infectious disease  Dehydration    New Prescriptions Current Discharge Medication List       Duffy Bruce, MD 12/27/16 1132

## 2016-12-27 NOTE — Progress Notes (Signed)
Pt heart rate burst of SVT 179, sustaining 130's.  Gave IV metoprolol 5 mg.  HR SR 82/  Paged Dr. Johny Drilling.

## 2016-12-27 NOTE — Consult Note (Signed)
[facility] Consult   Reason for Consult:  Suicide ideation Referring Physician:  Dr. Doyle Askew Patient Identification: Carrie Macias MRN:  [id number] Principal Diagnosis: Dysthymic disorder Diagnosis:   Patient Active Problem List   Diagnosis Date Noted  . Sepsis ([place]) [A41.9] 12/27/2016  . Hypertension [I10]   . Thyroid disease [E07.9]   . Depression with suicidal ideation [F32.9, R45.851] 12/13/2016  . Female cystocele [N81.10] 02/07/2014  . History of vitamin D deficiency [Z86.39] 01/27/2012  . Hypercholesterolemia [E78.00]   . Back pain, chronic [M54.9, G89.29]     Total Time spent with patient: 1 hour  Subjective:   Carrie Macias is a 61 y.o. female patient admitted with nausea and stomach pain.  HPI:  Carrie Macias is a 61 years old female, seen, chart reviewed for this face-to-face psychiatric consultation and evaluation. Patient has been lying in her bed with the some symptoms of depression anxiety. Patient reported she has been survivor of the thyroid cancer and status post thyroid surgery site is 63. Patient reported she has been feeling depressed and anxious since then and recently worried about increased stomach problems including GERD which resulted nausea and abdominal pain, decreased appetite and loss of significant weight. Patient also complaining about difficulty sleeping because of her heart burns and indigestion. Patient stated she does not want to see psychiatric providers and would like to go back and see her primary care provider at [facility], Dr. Kenton Kingfisher at [facility]. patient also reported and not suicidal, don't want to die and I want to get better and go home. Patient reportedly lives with her sister who is 63 years older than her and able to care for her and home on regular basis. Patient reported she is retired from [employer] about a year ago. Patient is willing to take medication Lexapro and Remeron after brief discussion about risk  and benefits. Patient also reported she has a lot of support from [place] down house and belongs to [employer]. She reported she has multiple restrictions about her diet and also reportedly lactose intolerance. Patient may benefit from referral to a dietitian.   Medical history: Patient is a 61 y.o. female with medical history significant of thyroid cancer s/p surgery and subsequent hypothyroidism, GERD, HLD, HTN, DM2, Depression and anxiety, chronic back pain who presents for nausea, decreased PO intake for about 1 week.  Also reports dysuria for 1-2 weeks.  She has a very depressed affect, so exact timeline was difficult to elicit.  She notes that she has bad reflux with anything she eats.  Basically everything she eats gives her heartburn or gas and so she stopped eating because she didn't see the point.  She has not taken her medications since Tuesday.  She notes increased nausea but no vomiting.  She has been having dysuria with lower abdominal pain.  She has maybe been feverish, but has not checked her temperature.  She has had some chest pain off and on for a couple of days, seems to be burning in nature.  She has also been thirsty, but has not noticed an increase in her urination.  She has generalized weakness and an area of ulceration on her left breast which she just noticed this morning.  She reports that she feels hungry, just doesn't see the point in eating.   Carrie Macias was last in the ED for SI and was placed in an inpatient psych facility.  She reports that she did not  think it helped her.  She is not currently reported to be on any psych medications and she has not been taking her medications for 1 week.  She further notes that she doesn't sleep at all and that she is taking care of her sister who has similar issues with depression and pain.    ED Course: In the ED, she was found to be acidotic with pH of 7.33, Bicarb of 13.  She had a mildly elevated glucose and an AG of 18.  She had  a lactic acid around 4.  Her WBC was 10.8, but in the setting of high hgb, likely related to dehydration.  BUN was 36.  CT abdomen and CXR did not show any acute source of infection.    Past Psychiatric History: Recent admission to psych for increased symptoms of depression and suicide attempt by cutting her wrist.  Risk to Self: Is patient at risk for suicide?: No Risk to Others:   Prior Inpatient Therapy:   Prior Outpatient Therapy:    Past Medical History:  Past Medical History:  Diagnosis Date  . Allergy   . Anxiety   . Asthma   . Back pain, chronic   . Cancer (HCC)    THYROID  . Depression   . Detrusor dyssynergia   . Diabetes mellitus    TYPE II  . GERD (gastroesophageal reflux disease)   . Heart murmur   . Hormone disorder    HYPOTHYROIDISM  . Hypercholesterolemia   . Hypertension   . Thyroid disease     Past Surgical History:  Procedure Laterality Date  . ABDOMINAL HYSTERECTOMY     LAVH/RSO/SLING/ANT/COLPO  . COLONOSCOPY  07/16/2010  . EXTERNAL EAR SURGERY    . INNER EAR SURGERY     IMPLANT  . TONSILLECTOMY AND ADENOIDECTOMY    . TUBAL LIGATION     Family History:  Family History  Problem Relation Age of Onset  . Diabetes Mother   . Hypertension Mother   . Heart disease Mother   . Fibromyalgia Mother   . Congestive Heart Failure Mother   . Hypertension Father   . Congestive Heart Failure Father   . Hypertension Sister   . COPD Sister   . OCD Sister   . Fibromyalgia Sister   . Bipolar disorder Sister   . Bipolar disorder Brother   . Bipolar disorder Sister   . Bipolar disorder Maternal Grandfather    Family Psychiatric  History: not significant Social History:  History  Alcohol Use  . 0.0 oz/week    Comment: SELDOM     History  Drug Use No    Social History   Social History  . Marital status: Widowed    Spouse name: N/A  . Number of children: N/A  . Years of education: N/A   Social History Main Topics  . Smoking status: Never  Smoker  . Smokeless tobacco: Never Used  . Alcohol use 0.0 oz/week     Comment: SELDOM  . Drug use: No  . Sexual activity: Not Currently    Birth control/ protection: Surgical     Comment: HYST   Other Topics Concern  . None   Social History Narrative  . None   Additional Social History:    Allergies:   Allergies  Allergen Reactions  . Aspirin Nausea Only    Sick to stomach  . Ibuprofen Nausea Only    Sick to stomach    Labs:  Results for orders placed or  performed during the hospital encounter of 12/26/16 (from the past 48 hour(s))  Lipase, blood     Status: None   Collection Time: 12/26/16  9:02 PM  Result Value Ref Range   Lipase 40 11 - 51 U/L  Comprehensive metabolic panel     Status: Abnormal   Collection Time: 12/26/16  9:02 PM  Result Value Ref Range   Sodium 142 135 - 145 mmol/L   Potassium 4.8 3.5 - 5.1 mmol/L   Chloride 111 101 - 111 mmol/L   CO2 13 (L) 22 - 32 mmol/L   Glucose, Bld 183 (H) 65 - 99 mg/dL   BUN 36 (H) 6 - 20 mg/dL   Creatinine, Ser 0.92 0.44 - 1.00 mg/dL   Calcium 10.4 (H) 8.9 - 10.3 mg/dL   Total Protein 7.2 6.5 - 8.1 g/dL   Albumin 4.1 3.5 - 5.0 g/dL   AST 31 15 - 41 U/L   ALT 30 14 - 54 U/L   Alkaline Phosphatase 67 38 - 126 U/L   Total Bilirubin 2.0 (H) 0.3 - 1.2 mg/dL   GFR calc non Af Amer >60 >60 mL/min   GFR calc Af Amer >60 >60 mL/min    Comment: (NOTE) The eGFR has been calculated using the CKD EPI equation. This calculation has not been validated in all clinical situations. eGFR's persistently <60 mL/min signify possible Chronic Kidney Disease.    Anion gap 18 (H) 5 - 15  CBC     Status: Abnormal   Collection Time: 12/26/16  9:02 PM  Result Value Ref Range   WBC 10.8 (H) 4.0 - 10.5 K/uL   RBC 5.74 (H) 3.87 - 5.11 MIL/uL   Hemoglobin 18.2 (H) 12.0 - 15.0 g/dL   HCT 52.2 (H) 36.0 - 46.0 %   MCV 90.9 78.0 - 100.0 fL   MCH 31.7 26.0 - 34.0 pg   MCHC 34.9 30.0 - 36.0 g/dL   RDW 12.0 11.5 - 15.5 %   Platelets 284  150 - 400 K/uL  Urinalysis, Routine w reflex microscopic     Status: Abnormal   Collection Time: 12/26/16  9:15 PM  Result Value Ref Range   Color, Urine AMBER (A) YELLOW    Comment: BIOCHEMICALS MAY BE AFFECTED BY COLOR   APPearance HAZY (A) CLEAR   Specific Gravity, Urine 1.028 1.005 - 1.030   pH 5.0 5.0 - 8.0   Glucose, UA 50 (A) NEGATIVE mg/dL   Hgb urine dipstick NEGATIVE NEGATIVE   Bilirubin Urine NEGATIVE NEGATIVE   Ketones, ur 80 (A) NEGATIVE mg/dL   Protein, ur 100 (A) NEGATIVE mg/dL   Nitrite NEGATIVE NEGATIVE   Leukocytes, UA SMALL (A) NEGATIVE   RBC / HPF 0-5 0 - 5 RBC/hpf   WBC, UA 6-30 0 - 5 WBC/hpf   Bacteria, UA NONE SEEN NONE SEEN   Squamous Epithelial / LPF 0-5 (A) NONE SEEN   Mucous PRESENT    Hyaline Casts, UA PRESENT   I-stat troponin, ED     Status: None   Collection Time: 12/26/16  9:51 PM  Result Value Ref Range   Troponin i, poc 0.01 0.00 - 0.08 ng/mL   Comment 3            Comment: Due to the release kinetics of cTnI, a negative result within the first hours of the onset of symptoms does not rule out myocardial infarction with certainty. If myocardial infarction is still suspected, repeat the test at appropriate intervals.  I-Stat CG4 Lactic Acid, ED  (not at  [facility])     Status: Abnormal   Collection Time: 12/26/16 10:13 PM  Result Value Ref Range   Lactic Acid, Venous 3.98 (HH) 0.5 - 1.9 mmol/L   Comment NOTIFIED PHYSICIAN   I-Stat Venous Blood Gas, ED (order at [facility] and [facility] only)     Status: Abnormal   Collection Time: 12/26/16 10:13 PM  Result Value Ref Range   pH, Ven 7.334 7.250 - 7.430   pCO2, Ven 35.7 (L) 44.0 - 60.0 mmHg   pO2, Ven 47.0 (H) 32.0 - 45.0 mmHg   Bicarbonate 19.0 (L) 20.0 - 28.0 mmol/L   TCO2 20 0 - 100 mmol/L   O2 Saturation 81.0 %   Acid-base deficit 6.0 (H) 0.0 - 2.0 mmol/L   Patient temperature HIDE    Sample type VENOUS   Salicylate level     Status: None   Collection Time: 12/26/16 10:43 PM  Result Value Ref Range    Salicylate Lvl <5.3 2.8 - 30.0 mg/dL  Acetaminophen level     Status: Abnormal   Collection Time: 12/26/16 10:43 PM  Result Value Ref Range   Acetaminophen (Tylenol), Serum <10 (L) 10 - 30 ug/mL    Comment:        THERAPEUTIC CONCENTRATIONS VARY SIGNIFICANTLY. A RANGE OF 10-30 ug/mL MAY BE AN EFFECTIVE CONCENTRATION FOR MANY PATIENTS. HOWEVER, SOME ARE BEST TREATED AT CONCENTRATIONS OUTSIDE THIS RANGE. ACETAMINOPHEN CONCENTRATIONS >150 ug/mL AT 4 HOURS AFTER INGESTION AND >50 ug/mL AT 12 HOURS AFTER INGESTION ARE OFTEN ASSOCIATED WITH TOXIC REACTIONS.   Ethanol     Status: None   Collection Time: 12/26/16 10:43 PM  Result Value Ref Range   Alcohol, Ethyl (B) <5 <5 mg/dL    Comment:        LOWEST DETECTABLE LIMIT FOR SERUM ALCOHOL IS 5 mg/dL FOR MEDICAL PURPOSES ONLY   POC CBG, ED     Status: Abnormal   Collection Time: 12/27/16 12:19 AM  Result Value Ref Range   Glucose-Capillary 144 (H) 65 - 99 mg/dL  I-Stat CG4 Lactic Acid, ED  (not at  [facility])     Status: Abnormal   Collection Time: 12/27/16 12:54 AM  Result Value Ref Range   Lactic Acid, Venous 3.21 (HH) 0.5 - 1.9 mmol/L   Comment NOTIFIED PHYSICIAN   Glucose, capillary     Status: Abnormal   Collection Time: 12/27/16  2:28 AM  Result Value Ref Range   Glucose-Capillary 129 (H) 65 - 99 mg/dL  Lactic acid, plasma     Status: None   Collection Time: 12/27/16  4:30 AM  Result Value Ref Range   Lactic Acid, Venous 1.7 0.5 - 1.9 mmol/L  Procalcitonin     Status: None   Collection Time: 12/27/16  4:30 AM  Result Value Ref Range   Procalcitonin <0.10 ng/mL    Comment:        Interpretation: PCT (Procalcitonin) <= 0.5 ng/mL: Systemic infection (sepsis) is not likely. Local bacterial infection is possible. (NOTE)         ICU PCT Algorithm               Non ICU PCT Algorithm    ----------------------------     ------------------------------         PCT < 0.25 ng/mL                 PCT < 0.1 ng/mL     Stopping of  antibiotics  Stopping of antibiotics       strongly encouraged.               strongly encouraged.    ----------------------------     ------------------------------       PCT level decrease by               PCT < 0.25 ng/mL       >= 80% from peak PCT       OR PCT 0.25 - 0.5 ng/mL          Stopping of antibiotics                                             encouraged.     Stopping of antibiotics           encouraged.    ----------------------------     ------------------------------       PCT level decrease by              PCT >= 0.25 ng/mL       < 80% from peak PCT        AND PCT >= 0.5 ng/mL            Continuin g antibiotics                                              encouraged.       Continuing antibiotics            encouraged.    ----------------------------     ------------------------------     PCT level increase compared          PCT > 0.5 ng/mL         with peak PCT AND          PCT >= 0.5 ng/mL             Escalation of antibiotics                                          strongly encouraged.      Escalation of antibiotics        strongly encouraged.   Protime-INR     Status: Abnormal   Collection Time: 12/27/16  4:30 AM  Result Value Ref Range   Prothrombin Time 15.6 (H) 11.4 - 15.2 seconds   INR 1.23   APTT     Status: None   Collection Time: 12/27/16  4:30 AM  Result Value Ref Range   aPTT 25 24 - 36 seconds  CBC     Status: Abnormal   Collection Time: 12/27/16  4:30 AM  Result Value Ref Range   WBC 14.2 (H) 4.0 - 10.5 K/uL   RBC 4.70 3.87 - 5.11 MIL/uL   Hemoglobin 14.7 12.0 - 15.0 g/dL   HCT 42.7 36.0 - 46.0 %   MCV 90.9 78.0 - 100.0 fL   MCH 31.3 26.0 - 34.0 pg   MCHC 34.4 30.0 - 36.0 g/dL   RDW 12.2 11.5 - 15.5 %   Platelets 187 150 - 400 K/uL  Creatinine, serum     Status: None  Collection Time: 12/27/16  4:30 AM  Result Value Ref Range   Creatinine, Ser 0.65 0.44 - 1.00 mg/dL   GFR calc non Af Amer >60 >60 mL/min   GFR calc Af Amer  >60 >60 mL/min    Comment: (NOTE) The eGFR has been calculated using the CKD EPI equation. This calculation has not been validated in all clinical situations. eGFR's persistently <60 mL/min signify possible Chronic Kidney Disease.   TSH     Status: Abnormal   Collection Time: 12/27/16  4:30 AM  Result Value Ref Range   TSH <0.010 (L) 0.350 - 4.500 uIU/mL    Comment: Performed by a 3rd Generation assay with a functional sensitivity of <=0.01 uIU/mL.  Troponin I     Status: Abnormal   Collection Time: 12/27/16  4:30 AM  Result Value Ref Range   Troponin I 0.03 (HH) <0.03 ng/mL    Comment: CRITICAL RESULT CALLED TO, READ BACK BY AND VERIFIED WITH: MOORE,K RN 12/27/2016 0531 JORDANS   Lactic acid, plasma     Status: None   Collection Time: 12/27/16  7:29 AM  Result Value Ref Range   Lactic Acid, Venous 1.0 0.5 - 1.9 mmol/L  Troponin I     Status: Abnormal   Collection Time: 12/27/16  7:29 AM  Result Value Ref Range   Troponin I 0.03 (HH) <0.03 ng/mL    Comment: CRITICAL VALUE NOTED.  VALUE IS CONSISTENT WITH PREVIOUSLY REPORTED AND CALLED VALUE.  Basic metabolic panel     Status: Abnormal   Collection Time: 12/27/16  7:29 AM  Result Value Ref Range   Sodium 140 135 - 145 mmol/L   Potassium 3.1 (L) 3.5 - 5.1 mmol/L   Chloride 111 101 - 111 mmol/L   CO2 18 (L) 22 - 32 mmol/L   Glucose, Bld 115 (H) 65 - 99 mg/dL   BUN 22 (H) 6 - 20 mg/dL   Creatinine, Ser 0.54 0.44 - 1.00 mg/dL   Calcium 8.8 (L) 8.9 - 10.3 mg/dL   GFR calc non Af Amer >60 >60 mL/min   GFR calc Af Amer >60 >60 mL/min    Comment: (NOTE) The eGFR has been calculated using the CKD EPI equation. This calculation has not been validated in all clinical situations. eGFR's persistently <60 mL/min signify possible Chronic Kidney Disease.    Anion gap 11 5 - 15  CBC     Status: Abnormal   Collection Time: 12/27/16  7:29 AM  Result Value Ref Range   WBC 12.9 (H) 4.0 - 10.5 K/uL   RBC 4.50 3.87 - 5.11 MIL/uL    Hemoglobin 14.1 12.0 - 15.0 g/dL   HCT 40.6 36.0 - 46.0 %   MCV 90.2 78.0 - 100.0 fL   MCH 31.3 26.0 - 34.0 pg   MCHC 34.7 30.0 - 36.0 g/dL   RDW 12.3 11.5 - 15.5 %   Platelets 189 150 - 400 K/uL  Glucose, capillary     Status: Abnormal   Collection Time: 12/27/16  8:52 AM  Result Value Ref Range   Glucose-Capillary 156 (H) 65 - 99 mg/dL  Glucose, capillary     Status: Abnormal   Collection Time: 12/27/16 11:48 AM  Result Value Ref Range   Glucose-Capillary 221 (H) 65 - 99 mg/dL    Current Facility-Administered Medications  Medication Dose Route Frequency Provider Last Rate Last Dose  . 0.9 %  sodium chloride infusion   Intravenous Continuous Theodis Blaze, MD 75 mL/hr at 12/27/16  1056    . acetaminophen (TYLENOL) tablet 650 mg  650 mg Oral Q6H PRN Sid Falcon, MD       Or  . acetaminophen (TYLENOL) suppository 650 mg  650 mg Rectal Q6H PRN Sid Falcon, MD      . Derrill Memo ON 12/28/2016] cefTRIAXone (ROCEPHIN) 2 g in dextrose 5 % 50 mL IVPB  2 g Intravenous Q24H Veronda P Bryk, RPH      . enoxaparin (LOVENOX) injection 40 mg  40 mg Subcutaneous Q24H Sid Falcon, MD      . insulin aspart (novoLOG) injection 0-5 Units  0-5 Units Subcutaneous QHS Sid Falcon, MD      . insulin aspart (novoLOG) injection 0-9 Units  0-9 Units Subcutaneous TID WC Sid Falcon, MD   2 Units at 12/27/16 [phone]  . metoprolol (LOPRESSOR) injection 5 mg  5 mg Intravenous Q6H PRN Theodis Blaze, MD      . ondansetron [facility]) tablet 4 mg  4 mg Oral Q6H PRN Sid Falcon, MD       Or  . ondansetron [facility]) injection 4 mg  4 mg Intravenous Q6H PRN Sid Falcon, MD      . oxyCODONE (Oxy IR/ROXICODONE) immediate release tablet 5 mg  5 mg Oral Q4H PRN Sid Falcon, MD      . pantoprazole (PROTONIX) EC tablet 40 mg  40 mg Oral Daily Sid Falcon, MD      . potassium chloride SA (K-DUR,KLOR-CON) CR tablet 40 mEq  40 mEq Oral Once Theodis Blaze, MD      . sodium chloride flush (NS) 0.9 % injection 3 mL   3 mL Intravenous Q12H Sid Falcon, MD        Musculoskeletal: Strength & Muscle Tone: within normal limits Gait & Station: normal Patient leans: N/A  Psychiatric Specialty Exam: Physical Exam as per history and physical   ROS complaining about generalized weakness, decreased appetite and intake, loss of weight and mild symptoms of depression and anxiety. Patient also complaining about nausea and stomach tenderness. This and denies chest pain and shortness of breath.  No Fever-chills, No Headache, No changes with Vision or hearing, reports vertigo No problems swallowing food or Liquids, No Chest pain, Cough or Shortness of Breath, No Abdominal pain, No Nausea or Vommitting, Bowel movements are regular, No Blood in stool or Urine, No dysuria, No new skin rashes or bruises, No new joints pains-aches,  No new weakness, tingling, numbness in any extremity, No recent weight gain or loss, No polyuria, polydypsia or polyphagia,  A full 10 point Review of Systems was done, except as stated above, all other Review of Systems were negative.  Blood pressure 137/68, pulse (!) 107, temperature 98.9 F (37.2 C), temperature source Oral, resp. rate 15, height 5' 7"  (1.702 m), weight 55.3 kg (122 lb), SpO2 99 %.Body mass index is 19.11 kg/m.  General Appearance: Guarded  Eye Contact:  Good  Speech:  Clear and Coherent and Slow  Volume:  Decreased  Mood:  Anxious and Depressed  Affect:  Constricted and Depressed  Thought Process:  Coherent and Goal Directed  Orientation:  Full (Time, Place, and Person)  Thought Content:  WDL  Suicidal Thoughts:  No  Homicidal Thoughts:  No  Memory:  Immediate;   Good Recent;   Good Remote;   Good  Judgement:  Fair  Insight:  Fair  Psychomotor Activity:  Decreased  Concentration:  Concentration: Good and  Attention Span: Fair  Recall:  Good  Fund of Knowledge:  Good  Language:  Good  Akathisia:  Negative  Handed:  Right  AIMS (if indicated):      Assets:  Communication Skills Desire for Improvement Financial Resources/Insurance Housing Leisure Time Resilience Social Support Talents/Skills Transportation  ADL's:  Intact  Cognition:  WNL  Sleep:        Treatment Plan Summary: 61 years old female with history of thyroid cancer, status post direct surgery and currently hypothyroidism presented with chronic history of depression and anxiety and recently increased stomach difficulties and weight loss. Patient has multiple restrictions about foot because of her Jehovah's Witness. Patient is willing to participate and medication management during my evaluation. Patient denies suicidal/homicidal ideation, intentions or plans. Patient has no evidence of psychosis.  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 Appreciate psychiatric consultation and we sign off as of today Please contact [phone] or [phone] if needs further assistance   Disposition: Patient does not meet criteria for psychiatric inpatient admission. Supportive therapy provided about ongoing stressors.  Ambrose Finland, MD 12/27/2016 12:13 PM

## 2016-12-27 NOTE — Progress Notes (Addendum)
Pt seen and examined at bedside, please see earlier admission note by Dr. Daryll Drown. Pt admitted after midnight. Patient was admitted for further evaluation of progressively worsening depression and were oral intake. In addition patient has had intermittent episodes of chest pains. Since admission vital signs have been stable, blood work this morning notable for K3.1. Will place on K Dur 40 mEq 1. Patient has been on telemetry since admission, denies chest pain this morning, troponin trend has been flat and not consistent with ACS. Psych consult requested. CBC and BMP in the morning. Place sitter at bedside due to reports of suicidal ideations.Pt placed on Rocephin IV for suspected UTI, urine cultures pending.   Faye Ramsay, MD  [facility] Pager [phone]  If 7PM-7AM, please contact night-coverage [email or web address] Password [email or web address]

## 2016-12-27 NOTE — Progress Notes (Signed)
Pt HR 141 while ambulating to restroom. Paged Dr. Johny Drilling.

## 2016-12-27 NOTE — Progress Notes (Signed)
Initial Nutrition Assessment  DOCUMENTATION CODES:   Severe malnutrition in context of acute illness/injury  INTERVENTION:  Provide Glucerna Shake po BID, each supplement provides 220 kcal and 10 grams of protein.  Encourage adequate PO intake.   NUTRITION DIAGNOSIS:   Malnutrition (severe) related to acute illness as evidenced by energy intake < or equal to 50% for > or equal to 5 days, moderate depletions of muscle mass.  GOAL:   Patient will meet greater than or equal to 90% of their needs  MONITOR:   PO intake, Supplement acceptance, Labs, Weight trends, Skin, I & O's  REASON FOR ASSESSMENT:   Malnutrition Screening Tool    ASSESSMENT:   61 y.o. female with medical history significant of thyroid cancer s/p surgery and subsequent hypothyroidism, GERD, HLD, HTN, DM2, Depression and anxiety, chronic back pain who presents for nausea, decreased PO intake for about 1 week.   Meal completion was 75% at lunch today. Pt reports appetite is just "ok". Pt reports poor po PTA. She reports consuming large amount of water and some juice. She was able to eat some food at meals however overall poor po intake due to nausea. Pt reports usual body weight of ~170 lbs which she reports last weighing in September 2017. Question accuracy of most recent recorded weight as pt was 160 lbs yesterday. RD to order nutritional supplements to aid in caloric and protein needs.  Nutrition-Focused physical exam completed. Findings are no fat depletion, moderate muscle depletion, and no edema.   Labs and medications reviewed.   Diet Order:  Diet regular Room service appropriate? Yes; Fluid consistency: Thin  Skin:  Reviewed, no issues  Last BM:  Unknown  Height:   Ht Readings from Last 1 Encounters:  12/26/16 5\' 7"  (1.702 m)    Weight:   Wt Readings from Last 1 Encounters:  12/27/16 122 lb (55.3 kg)  12/26/16 160 lb (72.6 kg)  Ideal Body Weight:  61.36 kg  BMI:  Body mass index is 19.11  kg/m.  Estimated Nutritional Needs:   Kcal:  1800-2000  Protein:  80-95 grams  Fluid:  1.8 - 2 L/day  EDUCATION NEEDS:   No education needs identified at this time  Corrin Parker, MS, RD, LDN Pager # [phone] After hours/ weekend pager # [phone]

## 2016-12-27 NOTE — Progress Notes (Signed)
CRITICAL VALUE ALERT  Critical value received:  Critical troponin = 0.03  Date of notification:  12/27/16  Time of notification:  4360  Critical value read back:Yes.    Nurse who received alert:  Mady Gemma, RN  MD notified (1st page):  Raliegh Ip Schorr  Time of first page:  [phone]

## 2016-12-28 ENCOUNTER — Inpatient Hospital Stay (HOSPITAL_COMMUNITY): Payer: BLUE CROSS/BLUE SHIELD

## 2016-12-28 DIAGNOSIS — F341 Dysthymic disorder: Secondary | ICD-10-CM

## 2016-12-28 DIAGNOSIS — R7989 Other specified abnormal findings of blood chemistry: Secondary | ICD-10-CM

## 2016-12-28 LAB — ECHOCARDIOGRAM COMPLETE
CHL CUP DOP CALC LVOT VTI: 21.4 cm
CHL CUP MV DEC (S): 180
E/e' ratio: 12.35
EWDT: 180 ms
FS: 31 % (ref 28–44)
HEIGHTINCHES: 64 in
IV/PV OW: 0.82
LA diam index: 2.02 cm/m2
LA vol A4C: 26.8 ml
LA vol index: 19.1 mL/m2
LA vol: 31.2 mL
LASIZE: 33 mm
LDCA: 3.14 cm2
LEFT ATRIUM END SYS DIAM: 33 mm
LV E/e' medial: 12.35
LV E/e'average: 12.35
LV TDI E'LATERAL: 6.2
LV TDI E'MEDIAL: 6.09
LV e' LATERAL: 6.2 cm/s
LVOT SV: 67 mL
LVOT peak grad rest: 5 mmHg
LVOTD: 20 mm
LVOTPV: 111 cm/s
Lateral S' vel: 26.6 cm/s
MV Peak grad: 2 mmHg
MV pk A vel: 112 m/s
MV pk E vel: 76.6 m/s
PW: 10.4 mm — AB (ref 0.6–1.1)
TAPSE: 27.9 mm
WEIGHTICAEL: 2086.4 [oz_av]

## 2016-12-28 LAB — CBC
HCT: 37.4 % (ref 36.0–46.0)
HEMOGLOBIN: 12.7 g/dL (ref 12.0–15.0)
MCH: 30.2 pg (ref 26.0–34.0)
MCHC: 34 g/dL (ref 30.0–36.0)
MCV: 89 fL (ref 78.0–100.0)
Platelets: 143 10*3/uL — ABNORMAL LOW (ref 150–400)
RBC: 4.2 MIL/uL (ref 3.87–5.11)
RDW: 11.8 % (ref 11.5–15.5)
WBC: 5.2 10*3/uL (ref 4.0–10.5)

## 2016-12-28 LAB — BASIC METABOLIC PANEL
ANION GAP: 10 (ref 5–15)
BUN: 10 mg/dL (ref 6–20)
CALCIUM: 9.2 mg/dL (ref 8.9–10.3)
CHLORIDE: 109 mmol/L (ref 101–111)
CO2: 21 mmol/L — ABNORMAL LOW (ref 22–32)
CREATININE: 0.41 mg/dL — AB (ref 0.44–1.00)
GFR calc non Af Amer: >60 mL/min (ref >60)
Glucose, Bld: 121 mg/dL — ABNORMAL HIGH (ref 65–99)
Potassium: 3.2 mmol/L — ABNORMAL LOW (ref 3.5–5.1)
SODIUM: 140 mmol/L (ref 135–145)

## 2016-12-28 LAB — GLUCOSE, CAPILLARY
GLUCOSE-CAPILLARY: 124 mg/dL — AB (ref 65–99)
GLUCOSE-CAPILLARY: 133 mg/dL — AB (ref 65–99)
GLUCOSE-CAPILLARY: 206 mg/dL — AB (ref 65–99)
Glucose-Capillary: 206 mg/dL — ABNORMAL HIGH (ref 65–99)

## 2016-12-28 LAB — URINE CULTURE

## 2016-12-28 LAB — HEMOGLOBIN A1C
HEMOGLOBIN A1C: 6.1 % — AB (ref 4.8–5.6)
MEAN PLASMA GLUCOSE: 128 mg/dL

## 2016-12-28 LAB — MAGNESIUM: MAGNESIUM: 1.7 mg/dL (ref 1.7–2.4)

## 2016-12-28 MED ORDER — POTASSIUM CHLORIDE CRYS ER 20 MEQ PO TBCR
40.0000 meq | EXTENDED_RELEASE_TABLET | Freq: Once | ORAL | Status: AC
  Administered 2016-12-28: 40 meq via ORAL
  Filled 2016-12-28: qty 2

## 2016-12-28 MED ORDER — SACCHAROMYCES BOULARDII 250 MG PO CAPS
250.0000 mg | ORAL_CAPSULE | Freq: Two times a day (BID) | ORAL | Status: DC
  Administered 2016-12-28 – 2016-12-29 (×3): 250 mg via ORAL
  Filled 2016-12-28 (×2): qty 1

## 2016-12-28 MED ORDER — DEXTROSE 5 % IV SOLN
1.0000 g | INTRAVENOUS | Status: DC
  Administered 2016-12-28: 1 g via INTRAVENOUS
  Filled 2016-12-28 (×2): qty 10

## 2016-12-28 MED ORDER — METOPROLOL SUCCINATE ER 25 MG PO TB24
25.0000 mg | ORAL_TABLET | Freq: Every day | ORAL | Status: DC
  Administered 2016-12-29: 25 mg via ORAL
  Filled 2016-12-28: qty 1

## 2016-12-28 NOTE — Progress Notes (Signed)
Pharmacy Antibiotic Consult Note  Carrie Macias is a 61 y.o. female admitted on 12/26/2016 with sepsis and UTI.  Pharmacy has been consulted for Rocephin dosing. Patient is afebrile, WBC are normal, PCT <0.1. UCx with multiple species.  Ceftriaxone 4/9 >>  Plan: Decrease ceftriaxone to 1 g IV q24h Consider total of 7 days, could switch to cephalexin 500 mg PO bid when oral is an option Pharmacy signing off  Height: 5\' 4"  (162.6 cm) Weight: 130 lb 6.4 oz (59.1 kg) IBW/kg (Calculated) : 54.7  Temp (24hrs), Avg:98.4 F (36.9 C), Min:97.9 F (36.6 C), Max:98.7 F (37.1 C)   Recent Labs Lab 12/26/16 2102 12/26/16 2213 12/27/16 0054 12/27/16 0430 12/27/16 0729 12/28/16 0553  WBC 10.8*  --   --  14.2* 12.9* 5.2  CREATININE 0.92  --   --  0.65 0.54 0.41*  LATICACIDVEN  --  3.98* 3.21* 1.7 1.0  --     Estimated Creatinine Clearance: 63.8 mL/min (A) (by C-G formula based on SCr of 0.41 mg/dL (L)).    Allergies  Allergen Reactions  . Aspirin Nausea Only    Sick to stomach  . Ibuprofen Nausea Only    Sick to stomach    Thank you for allowing pharmacy to be a part of this patient's care.  Renold Genta, PharmD, BCPS Clinical Pharmacist Phone for today - [place] - [phone] 12/28/2016 3:28 PM

## 2016-12-28 NOTE — Progress Notes (Signed)
  Echocardiogram 2D Echocardiogram has been performed.  Carrie Macias 12/28/2016, 12:00 PM

## 2016-12-28 NOTE — Progress Notes (Addendum)
2D echo showed low normal LVF with EF 50-55% with no RWMAs and G1DD.  Minimal flat trop elevation c/w demand ischemia from SVT.  No further ischemic workup at this time as she has not had CP and only palpitations.  Could consider outpt stress test once patient is euthyroid give her CRFs including HTN, DM2, family history of CAD and hyperlipidemia.  Will have patient followup in our office. Will sign off.  Call with any questions.

## 2016-12-28 NOTE — Progress Notes (Signed)
Patient ID: Carrie Macias, female   DOB: [birth year], 61 y.o.   MRN: [id number]   PROGRESS NOTE    Carrie Macias  QMV:[id number] DOB: 1956-09-11 DOA: 12/26/2016  PCP: Shirline Frees, MD   Brief Narrative:  Patient was admitted for further evaluation of progressively worsening depression and were oral intake. In addition, pt reports urinary urgency but no frequency, some dysuria.  In addition patient has had intermittent episodes of chest pains.  Assessment & Plan:   SVT, symptomatic, chest pain  - palpitations resolved with beta blocker and BP stable  - supplement K to keep >4 - keep on tele for now  - Minimally elevated troponin at 0.03 x 2 and < 0.3 likely secondary to SVT - ECHO pending - cardiology team will set up outpatient follow up   Sepsis with dehydration  - Elevated HR on admission with high lactate and leukocytosis - suspect UTI with dehydration  - lactic acid now cleared and WBC is WNL - continue Rocephin day #2 - urine culture with multiple species, will ask for recollection  - stop IVF and advance diet   Hypokalemia - supplement and repeat BMP in AM  Diarrhea - this AM, add probiotic as pt is on ABX - rule out C. Diff   DM2 - She is supposed to be on metformin, but has not been taking - A1C on this admission is 6.1 - SSI for now  Thrombocytopenia - reactive, monitor   Severe Depression  - per psych, start escitalopram 5 mg daily which can be increased to 20 mg when patient can tolerate and benefit clinically - Start mirtazapine 7.5 mg daily at bedtime for improving insomnia and better appetite - Discontinued safety sitter as patient contract for safety in the hospital - social service consult for referring outpatient medication management when medically stable.    Back pain, chronic - Pain control with tylenol or oxycodone    Hypertension - on Metoprolol, SBP in 140's    Hypothyroidism - At last check, TSH was low indicating dose of synthroid  likely too high - TSH < 0.010, off synthroid for now  Severe PCM - nutritionist following   DVT prophylaxis: Lovenox SQ Code Status: Full  Family Communication: Patient at bedside  Disposition Plan: likely home in 1-2 days   Consultants:   Psych  Cardiology   Procedures:   None  Antimicrobials:   Rocephin 4/9 -->  Subjective: No events overnight, pt reports feeling little bit better.   Objective: Vitals:   12/28/16 0548 12/28/16 0600 12/28/16 0846 12/28/16 0952  BP: 140/63 (!) 142/69 (!) 145/71 (!) 142/75  Pulse: 80 74 74 78  Resp: 17   18  Temp: 98.3 F (36.8 C)   97.9 F (36.6 C)  TempSrc: Oral   Oral  SpO2: 100% 99%  100%  Weight:      Height:        Intake/Output Summary (Last 24 hours) at 12/28/16 1130 Last data filed at 12/28/16 0953  Gross per 24 hour  Intake              980 ml  Output             2150 ml  Net            -1170 ml   Filed Weights   12/26/16 2301 12/27/16 0255 12/27/16 2132  Weight: 72.6 kg (160 lb) 55.3 kg (122 lb) 59.1 kg (130 lb 6.4 oz)    Examination:  General exam: Appears calm and comfortable  Respiratory system: Clear to auscultation. Respiratory effort normal. Cardiovascular system: S1 & S2 heard, RRR. No JVD, murmurs, rubs, gallops or clicks. No pedal edema. Gastrointestinal system: Abdomen is nondistended, soft and nontender. No organomegaly or masses felt.  Central nervous system: Alert and oriented. No focal neurological deficits. Extremities: Symmetric 5 x 5 power.  Data Reviewed: I have personally reviewed following labs and imaging studies  CBC:  Recent Labs Lab 12/26/16 2102 12/27/16 0430 12/27/16 0729 12/28/16 0553  WBC 10.8* 14.2* 12.9* 5.2  HGB 18.2* 14.7 14.1 12.7  HCT 52.2* 42.7 40.6 37.4  MCV 90.9 90.9 90.2 89.0  PLT 284 187 189 102*   Basic Metabolic Panel:  Recent Labs Lab 12/26/16 2102 12/27/16 0430 12/27/16 0729 12/28/16 0553 12/28/16 0957  NA 142  --  140 140  --   K 4.8  --   3.1* 3.2*  --   CL 111  --  111 109  --   CO2 13*  --  18* 21*  --   GLUCOSE 183*  --  115* 121*  --   BUN 36*  --  22* 10  --   CREATININE 0.92 0.65 0.54 0.41*  --   CALCIUM 10.4*  --  8.8* 9.2  --   MG  --   --   --   --  1.7   Liver Function Tests:  Recent Labs Lab 12/26/16 2102  AST 31  ALT 30  ALKPHOS 67  BILITOT 2.0*  PROT 7.2  ALBUMIN 4.1    Recent Labs Lab 12/26/16 2102  LIPASE 40   Coagulation Profile:  Recent Labs Lab 12/27/16 0430  INR 1.23   Cardiac Enzymes:  Recent Labs Lab 12/27/16 0430 12/27/16 0729 12/27/16 1448  TROPONINI 0.03* 0.03* <0.03   HbA1C:  Recent Labs  12/27/16 0729  HGBA1C 6.1*   CBG:  Recent Labs Lab 12/27/16 1148 12/27/16 1710 12/27/16 2137 12/28/16 0822 12/28/16 1118  GLUCAP 221* 169* 190* 124* 133*   Thyroid Function Tests:  Recent Labs  12/27/16 0430  TSH <0.010*   Urine analysis:    Component Value Date/Time   COLORURINE AMBER (A) 12/26/2016 2115   APPEARANCEUR HAZY (A) 12/26/2016 2115   LABSPEC 1.028 12/26/2016 2115   PHURINE 5.0 12/26/2016 2115   GLUCOSEU 50 (A) 12/26/2016 2115   HGBUR NEGATIVE 12/26/2016 2115   BILIRUBINUR NEGATIVE 12/26/2016 2115   KETONESUR 80 (A) 12/26/2016 2115   PROTEINUR 100 (A) 12/26/2016 2115   UROBILINOGEN 0.2 02/01/2013 1322   NITRITE NEGATIVE 12/26/2016 2115   LEUKOCYTESUR SMALL (A) 12/26/2016 2115   Recent Results (from the past 240 hour(s))  Blood Culture (routine x 2)     Status: None (Preliminary result)   Collection Time: 12/26/16  8:22 PM  Result Value Ref Range Status   Specimen Description BLOOD RIGHT ANTECUBITAL  Final   Special Requests   Final    BOTTLES DRAWN AEROBIC AND ANAEROBIC Blood Culture adequate volume   Culture NO GROWTH 2 DAYS  Final   Report Status PENDING  Incomplete  Urine culture     Status: Abnormal   Collection Time: 12/26/16  9:15 PM  Result Value Ref Range Status   Specimen Description URINE, CLEAN CATCH  Final   Special  Requests NONE  Final   Culture MULTIPLE SPECIES PRESENT, SUGGEST RECOLLECTION (A)  Final   Report Status 12/28/2016 FINAL  Final  Blood Culture (routine x 2)     Status:  None (Preliminary result)   Collection Time: 12/26/16 10:22 PM  Result Value Ref Range Status   Specimen Description BLOOD LEFT ANTECUBITAL  Final   Special Requests   Final    BOTTLES DRAWN AEROBIC AND ANAEROBIC Blood Culture adequate volume   Culture NO GROWTH 2 DAYS  Final   Report Status PENDING  Incomplete      Radiology Studies: Dg Chest 2 View  Result Date: 12/26/2016 CLINICAL DATA:  61 y/o  F; chest pain and tachycardia. EXAM: CHEST  2 VIEW COMPARISON:  11/23/2014 chest radiograph FINDINGS: Stable normal cardiac silhouette. Aortic atherosclerosis with calcification. Clear lungs. No pleural effusion or pneumothorax. Bones are unremarkable. Surgical clips project over right lower neck. IMPRESSION: No active cardiopulmonary disease. Electronically Signed   By: Kristine Garbe M.D.   On: 12/26/2016 22:26   Ct Abdomen Pelvis W Contrast  Result Date: 12/27/2016 CLINICAL DATA:  Acute onset of generalized abdominal pain, nausea and vomiting. Initial encounter. EXAM: CT ABDOMEN AND PELVIS WITH CONTRAST TECHNIQUE: Multidetector CT imaging of the abdomen and pelvis was performed using the standard protocol following bolus administration of intravenous contrast. CONTRAST:  147mL ISOVUE-300 IOPAMIDOL (ISOVUE-300) INJECTION 61% COMPARISON:  CT of the abdomen and pelvis from 10/21/2016 FINDINGS: Lower chest: The visualized lung bases are grossly clear. The visualized portions of the mediastinum are unremarkable. Hepatobiliary: A tiny 4 mm nonspecific hypodensity is noted at the right hepatic lobe. The liver is otherwise unremarkable in appearance. The gallbladder is grossly unremarkable. The common bile duct remains normal in caliber. Pancreas: The pancreas is within normal limits. Spleen: The spleen is unremarkable in  appearance. Adrenals/Urinary Tract: The adrenal glands are unremarkable in appearance. The kidneys are within normal limits. There is no evidence of hydronephrosis. No renal or ureteral stones are identified. No perinephric stranding is seen. Stomach/Bowel: The stomach is unremarkable in appearance. The small bowel is within normal limits. The appendix is normal in caliber, without evidence of appendicitis. The colon is unremarkable in appearance. Vascular/Lymphatic: Scattered calcification is seen along the abdominal aorta and its branches. The abdominal aorta is otherwise grossly unremarkable. The inferior vena cava is grossly unremarkable. No retroperitoneal lymphadenopathy is seen. No pelvic sidewall lymphadenopathy is identified. Reproductive: The bladder is mildly distended and grossly unremarkable. The patient is status post hysterectomy. No suspicious adnexal masses are seen. Other: No additional soft tissue abnormalities are seen. Musculoskeletal: No acute osseous abnormalities are identified. The visualized musculature is unremarkable in appearance. IMPRESSION: 1. No acute abnormality seen within the abdomen or pelvis. 2. Scattered aortic atherosclerosis. 3. Tiny 4 mm nonspecific hypodensity at the right hepatic lobe. This is likely benign, and is grossly unchanged from February. Electronically Signed   By: Garald Balding M.D.   On: 12/27/2016 00:01    Scheduled Meds: . cefTRIAXone (ROCEPHIN)  IV  2 g Intravenous Q24H  . enoxaparin (LOVENOX) injection  40 mg Subcutaneous Q24H  . escitalopram  5 mg Oral Daily  . feeding supplement (GLUCERNA SHAKE)  237 mL Oral BID BM  . insulin aspart  0-5 Units Subcutaneous QHS  . insulin aspart  0-9 Units Subcutaneous TID WC  . [START ON 12/29/2016] metoprolol succinate  25 mg Oral Daily  . mirtazapine  7.5 mg Oral QHS  . pantoprazole  40 mg Oral Daily  . sodium chloride flush  3 mL Intravenous Q12H   Continuous Infusions:   LOS: 1 day    Time spent: 20  minutes    Faye Ramsay, MD [facility] Pager  [phone]  If 7PM-7AM, please contact night-coverage [email or web address] Password [email or web address] 12/28/2016, 11:30 AM

## 2016-12-28 NOTE — Progress Notes (Addendum)
Progress Note  Patient Name: Carrie Macias Date of Encounter: 12/28/2016  Primary Cardiologist: new - Dr. Radford Pax  Subjective   Patient is feeling well; denies chest pain, SOB, and palpitations. She still feels weak and is having diarrhea.   Inpatient Medications    Scheduled Meds: . cefTRIAXone (ROCEPHIN)  IV  2 g Intravenous Q24H  . enoxaparin (LOVENOX) injection  40 mg Subcutaneous Q24H  . escitalopram  5 mg Oral Daily  . feeding supplement (GLUCERNA SHAKE)  237 mL Oral BID BM  . insulin aspart  0-5 Units Subcutaneous QHS  . insulin aspart  0-9 Units Subcutaneous TID WC  . metoprolol succinate  25 mg Oral Daily  . mirtazapine  7.5 mg Oral QHS  . pantoprazole  40 mg Oral Daily  . sodium chloride flush  3 mL Intravenous Q12H   Continuous Infusions: . sodium chloride 75 mL/hr at 12/27/16 1056   PRN Meds: acetaminophen **OR** acetaminophen, metoprolol, ondansetron **OR** ondansetron (ZOFRAN) IV, oxyCODONE   Vital Signs    Vitals:   12/28/16 0548 12/28/16 0600 12/28/16 0846 12/28/16 0952  BP: 140/63 (!) 142/69 (!) 145/71 (!) 142/75  Pulse: 80 74 74 78  Resp: 17   18  Temp: 98.3 F (36.8 C)   97.9 F (36.6 C)  TempSrc: Oral   Oral  SpO2: 100% 99%  100%  Weight:      Height:        Intake/Output Summary (Last 24 hours) at 12/28/16 1007 Last data filed at 12/28/16 0953  Gross per 24 hour  Intake              980 ml  Output             2150 ml  Net            -1170 ml   Filed Weights   12/26/16 2301 12/27/16 0255 12/27/16 2132  Weight: 160 lb (72.6 kg) 122 lb (55.3 kg) 130 lb 6.4 oz (59.1 kg)     Physical Exam   General: Well developed, well nourished, female appearing in no acute distress. Head: Normocephalic, atraumatic.  Neck: Supple without bruits, no JVD Lungs:  Resp regular and unlabored, CTA. Heart: RRR, S1, S2, no S3, S4, or murmur; no rub. Abdomen: Soft, mildly tender to palpation, non-distended with normoactive bowel sounds. No hepatomegaly.  No rebound/guarding. No obvious abdominal masses. Extremities: No clubbing, cyanosis, No edema. Distal pedal pulses are 2+ bilaterally. Neuro: Alert and oriented X 3. Moves all extremities spontaneously. Psych: Normal affect.  Labs    Chemistry Recent Labs Lab 12/26/16 2102 12/27/16 0430 12/27/16 0729 12/28/16 0553  NA 142  --  140 140  K 4.8  --  3.1* 3.2*  CL 111  --  111 109  CO2 13*  --  18* 21*  GLUCOSE 183*  --  115* 121*  BUN 36*  --  22* 10  CREATININE 0.92 0.65 0.54 0.41*  CALCIUM 10.4*  --  8.8* 9.2  PROT 7.2  --   --   --   ALBUMIN 4.1  --   --   --   AST 31  --   --   --   ALT 30  --   --   --   ALKPHOS 67  --   --   --   BILITOT 2.0*  --   --   --   GFRNONAA >60 >60 >60 >60  GFRAA >60 >60 >60 >60  ANIONGAP 18*  --  11 10     Hematology Recent Labs Lab 12/27/16 0430 12/27/16 0729 12/28/16 0553  WBC 14.2* 12.9* 5.2  RBC 4.70 4.50 4.20  HGB 14.7 14.1 12.7  HCT 42.7 40.6 37.4  MCV 90.9 90.2 89.0  MCH 31.3 31.3 30.2  MCHC 34.4 34.7 34.0  RDW 12.2 12.3 11.8  PLT 187 189 143*    Cardiac Enzymes Recent Labs Lab 12/27/16 0430 12/27/16 0729 12/27/16 1448  TROPONINI 0.03* 0.03* <0.03    Recent Labs Lab 12/26/16 2151  TROPIPOC 0.01     BNPNo results for input(s): BNP, PROBNP in the last 168 hours.   DDimer No results for input(s): DDIMER in the last 168 hours.   Radiology    Dg Chest 2 View  Result Date: 12/26/2016 CLINICAL DATA:  62 y/o  F; chest pain and tachycardia. EXAM: CHEST  2 VIEW COMPARISON:  11/23/2014 chest radiograph FINDINGS: Stable normal cardiac silhouette. Aortic atherosclerosis with calcification. Clear lungs. No pleural effusion or pneumothorax. Bones are unremarkable. Surgical clips project over right lower neck. IMPRESSION: No active cardiopulmonary disease. Electronically Signed   By: Kristine Garbe M.D.   On: 12/26/2016 22:26   Ct Abdomen Pelvis W Contrast  Result Date: 12/27/2016 CLINICAL DATA:  Acute onset of  generalized abdominal pain, nausea and vomiting. Initial encounter. EXAM: CT ABDOMEN AND PELVIS WITH CONTRAST TECHNIQUE: Multidetector CT imaging of the abdomen and pelvis was performed using the standard protocol following bolus administration of intravenous contrast. CONTRAST:  129mL ISOVUE-300 IOPAMIDOL (ISOVUE-300) INJECTION 61% COMPARISON:  CT of the abdomen and pelvis from 10/21/2016 FINDINGS: Lower chest: The visualized lung bases are grossly clear. The visualized portions of the mediastinum are unremarkable. Hepatobiliary: A tiny 4 mm nonspecific hypodensity is noted at the right hepatic lobe. The liver is otherwise unremarkable in appearance. The gallbladder is grossly unremarkable. The common bile duct remains normal in caliber. Pancreas: The pancreas is within normal limits. Spleen: The spleen is unremarkable in appearance. Adrenals/Urinary Tract: The adrenal glands are unremarkable in appearance. The kidneys are within normal limits. There is no evidence of hydronephrosis. No renal or ureteral stones are identified. No perinephric stranding is seen. Stomach/Bowel: The stomach is unremarkable in appearance. The small bowel is within normal limits. The appendix is normal in caliber, without evidence of appendicitis. The colon is unremarkable in appearance. Vascular/Lymphatic: Scattered calcification is seen along the abdominal aorta and its branches. The abdominal aorta is otherwise grossly unremarkable. The inferior vena cava is grossly unremarkable. No retroperitoneal lymphadenopathy is seen. No pelvic sidewall lymphadenopathy is identified. Reproductive: The bladder is mildly distended and grossly unremarkable. The patient is status post hysterectomy. No suspicious adnexal masses are seen. Other: No additional soft tissue abnormalities are seen. Musculoskeletal: No acute osseous abnormalities are identified. The visualized musculature is unremarkable in appearance. IMPRESSION: 1. No acute abnormality  seen within the abdomen or pelvis. 2. Scattered aortic atherosclerosis. 3. Tiny 4 mm nonspecific hypodensity at the right hepatic lobe. This is likely benign, and is grossly unchanged from February. Electronically Signed   By: Garald Balding M.D.   On: 12/27/2016 00:01     Telemetry    NSR in the 70-90s - Personally Reviewed  ECG    No new tracings - Personally Reviewed   Cardiac Studies   Echocardiogram 12/28/16: pending  Patient Profile     62 y.o. female with a PMH significant for thyroid disease, GERD, HLD< HTN, DM, depression/anxiety, and chronic back pain being evaluated by  cardiology for SVT.   Assessment & Plan    1.  SVT - symptomatic.  She says that she could feel her heart racing on admission and has noted elevated HRs at home.   - will check 2D echo but most likely related to hyperthyroidism- now off synthroid.  - continue toprol 25 mg - changed to morning administration - HR now 80s  2. Mildly elevated troponin with no chest pain - most likely due to tachycardia - will check echo.  If normal LVF then no further ischemic workup at this time.   3. Sepsis- antibiotics per IM  4. HTN - BP 140s/70s for the most part but some elevations this am - continue to follow - will change Toprol to am dosing  5. Diarrhea - may consider lactobacillus in presence of ABX, per primary team   Signed, Ledora Bottcher , PA-C 10:07 AM 12/28/2016 Pager: [phone]  Patient seen and independently examined with Fabian Sharp, PA. We discussed all aspects of the encounter. I agree with the assessment and plan as stated above with minor modifications.  Palpitations have resolved and HR normalized on BB. Would replete K to keep > 4. She did have a 5 beat run of WCT that could have been atrial tach with abberration.  This was in the setting of hypokalemia.  Will change to am dosing of Toprol as BP elevated in the am.  Now off synthroid for her hyperthyroidism.  Minimally elevated  troponin at 0.03 x 2 and < 0.3 likely secondary to SVT. Will check echo and if normal then no further cardiac workup.  Will set up outpt followup in our office.   Signed: Fransico Him, MD [facility] 12/28/2016

## 2016-12-29 ENCOUNTER — Other Ambulatory Visit (HOSPITAL_COMMUNITY): Payer: BLUE CROSS/BLUE SHIELD

## 2016-12-29 LAB — BASIC METABOLIC PANEL
ANION GAP: 11 (ref 5–15)
BUN: 6 mg/dL (ref 6–20)
CHLORIDE: 113 mmol/L — AB (ref 101–111)
CO2: 19 mmol/L — ABNORMAL LOW (ref 22–32)
Calcium: 9.5 mg/dL (ref 8.9–10.3)
Creatinine, Ser: 0.39 mg/dL — ABNORMAL LOW (ref 0.44–1.00)
GFR calc Af Amer: >60 mL/min (ref >60)
GLUCOSE: 103 mg/dL — AB (ref 65–99)
POTASSIUM: 4.7 mmol/L (ref 3.5–5.1)
Sodium: 143 mmol/L (ref 135–145)

## 2016-12-29 LAB — GLUCOSE, CAPILLARY
GLUCOSE-CAPILLARY: 163 mg/dL — AB (ref 65–99)
Glucose-Capillary: 108 mg/dL — ABNORMAL HIGH (ref 65–99)

## 2016-12-29 LAB — CBC
HEMATOCRIT: 37.1 % (ref 36.0–46.0)
HEMOGLOBIN: 12.9 g/dL (ref 12.0–15.0)
MCH: 31.2 pg (ref 26.0–34.0)
MCHC: 34.8 g/dL (ref 30.0–36.0)
MCV: 89.6 fL (ref 78.0–100.0)
PLATELETS: DECREASED 10*3/uL (ref 150–400)
RBC: 4.14 MIL/uL (ref 3.87–5.11)
RDW: 11.8 % (ref 11.5–15.5)
WBC: 5.5 10*3/uL (ref 4.0–10.5)

## 2016-12-29 LAB — URINE CULTURE: Culture: NO GROWTH

## 2016-12-29 MED ORDER — DOXYCYCLINE HYCLATE 100 MG PO TABS: 100.0000 mg | ORAL_TABLET | Freq: Two times a day (BID) | ORAL | 0 refills | Status: DC

## 2016-12-29 MED ORDER — MIRTAZAPINE 7.5 MG PO TABS: 7.5000 mg | ORAL_TABLET | Freq: Every day | ORAL | 0 refills | Status: DC

## 2016-12-29 MED ORDER — METOPROLOL SUCCINATE ER 25 MG PO TB24: 25.0000 mg | ORAL_TABLET | Freq: Every day | ORAL | 0 refills | Status: DC

## 2016-12-29 MED ORDER — SACCHAROMYCES BOULARDII 250 MG PO CAPS: 250.0000 mg | ORAL_CAPSULE | Freq: Two times a day (BID) | ORAL | 0 refills | Status: DC

## 2016-12-29 MED ORDER — ESCITALOPRAM OXALATE 5 MG PO TABS: 5.0000 mg | ORAL_TABLET | Freq: Every day | ORAL | 0 refills | Status: DC

## 2016-12-29 MED ORDER — TRAMADOL HCL 50 MG PO TABS: 50.0000 mg | ORAL_TABLET | Freq: Four times a day (QID) | ORAL | 0 refills | Status: DC | PRN

## 2016-12-29 NOTE — Discharge Instructions (Signed)

## 2016-12-29 NOTE — Discharge Summary (Addendum)
Physician Discharge Summary  Carrie Macias XTG:[id number] DOB: Jun 17, 1956 DOA: 12/26/2016  PCP: Shirline Frees, MD  Admit date: 12/26/2016 Discharge date: 12/29/2016  Recommendations for Outpatient Follow-up:  1. Pt will need to follow up with PCP in 1 week post discharge 2. Please obtain BMP to evaluate electrolytes and kidney function 3. Please also check CBC to evaluate Hg and Hct levels 4. Please note that pt was apparently told that she needs to be taking synthroid to keep TSH low but TSH is < 0.01, so we held it while inpatient but please discuss with pt if this needs to be continued or stopped until TSH stabilizes  5. Doxycycline for 4 more days 6. Started Lexapro and Mirtazapine per psych recommendations  7. Bisoprolol - HCTZ stopped per cardiology team recommendations  8. Pt referred to outpatient psych per recommendation for psychiatrist   Discharge Diagnoses:  Principal Problem:   Dysthymic disorder Active Problems:   Hypercholesterolemia   Back pain, chronic   Depression with suicidal ideation   Sepsis ([place])   Hypertension   Thyroid disease   SVT (supraventricular tachycardia) (HCC)   Protein-calorie malnutrition, severe  Discharge Condition: Stable  Diet recommendation: Heart healthy diet discussed in details   Brief Narrative:  Patient was admitted for further evaluation of progressively worsening depression and were oral intake. In addition, pt reports urinary urgency but no frequency, some dysuria.  In addition patient has had intermittent episodes of chest pains.  Assessment & Plan:   SVT, symptomatic, chest pain  - palpitations resolved with beta blocker and BP stable  - Minimally elevated troponin at 0.03 x 2 and <0.3 likely secondary to SVT - ECHO with stable EF - cardiology team will set up outpatient follow up   Sepsis with dehydration  - Elevated HR on admission with high lactate and leukocytosis - suspect UTI with dehydration  - lactic acid  now cleared and WBC is WNL - was on Rocephin but changed to PO ABX on discharge to complete therapy, doxycycline   Hypokalemia - supplemented   Diarrhea - resolved per pt   DM2 - She is supposed to be on metformin, but has not been taking - discussed compliance and pt verbalized understanding   Thrombocytopenia - reactive  Severe Depression  - per psych, start escitalopram 5 mg daily which can be increased to 20 mg when patient can tolerate and benefit clinically - Start mirtazapine 7.5 mg daily at bedtime for improving insomnia and better appetite - social service consulted for referring outpatient medication management when medically stable.  Back pain, chronic - Pain control with tylenol if needed   Hypertension - on Metoprolol and Losartan   Hypothyroidism - At last check, TSH was low indicating dose of synthroid likely too high - TSH < 0.010, off synthroid for now but pt says she was told she must take it  - I have advised to her to see PCP to further discuss   Severe PCM - nutritionist following   DVT prophylaxis: Lovenox SQ Code Status: Full  Family Communication: Patient at bedside  Disposition Plan: home   Consultants:   Psych  Cardiology   Procedures:   None  Antimicrobials:   Rocephin 4/9 --> changed to doxycycline on discharge to complete therapy for 4 more days   Procedures/Studies: Dg Chest 2 View  Result Date: 12/26/2016 CLINICAL DATA:  61 y/o  F; chest pain and tachycardia. EXAM: CHEST  2 VIEW COMPARISON:  11/23/2014 chest radiograph FINDINGS: Stable normal  cardiac silhouette. Aortic atherosclerosis with calcification. Clear lungs. No pleural effusion or pneumothorax. Bones are unremarkable. Surgical clips project over right lower neck. IMPRESSION: No active cardiopulmonary disease. Electronically Signed   By: Kristine Garbe M.D.   On: 12/26/2016 22:26   Ct Abdomen Pelvis W Contrast  Result Date: 12/27/2016 CLINICAL  DATA:  Acute onset of generalized abdominal pain, nausea and vomiting. Initial encounter. EXAM: CT ABDOMEN AND PELVIS WITH CONTRAST TECHNIQUE: Multidetector CT imaging of the abdomen and pelvis was performed using the standard protocol following bolus administration of intravenous contrast. CONTRAST:  115mL ISOVUE-300 IOPAMIDOL (ISOVUE-300) INJECTION 61% COMPARISON:  CT of the abdomen and pelvis from 10/21/2016 FINDINGS: Lower chest: The visualized lung bases are grossly clear. The visualized portions of the mediastinum are unremarkable. Hepatobiliary: A tiny 4 mm nonspecific hypodensity is noted at the right hepatic lobe. The liver is otherwise unremarkable in appearance. The gallbladder is grossly unremarkable. The common bile duct remains normal in caliber. Pancreas: The pancreas is within normal limits. Spleen: The spleen is unremarkable in appearance. Adrenals/Urinary Tract: The adrenal glands are unremarkable in appearance. The kidneys are within normal limits. There is no evidence of hydronephrosis. No renal or ureteral stones are identified. No perinephric stranding is seen. Stomach/Bowel: The stomach is unremarkable in appearance. The small bowel is within normal limits. The appendix is normal in caliber, without evidence of appendicitis. The colon is unremarkable in appearance. Vascular/Lymphatic: Scattered calcification is seen along the abdominal aorta and its branches. The abdominal aorta is otherwise grossly unremarkable. The inferior vena cava is grossly unremarkable. No retroperitoneal lymphadenopathy is seen. No pelvic sidewall lymphadenopathy is identified. Reproductive: The bladder is mildly distended and grossly unremarkable. The patient is status post hysterectomy. No suspicious adnexal masses are seen. Other: No additional soft tissue abnormalities are seen. Musculoskeletal: No acute osseous abnormalities are identified. The visualized musculature is unremarkable in appearance. IMPRESSION: 1.  No acute abnormality seen within the abdomen or pelvis. 2. Scattered aortic atherosclerosis. 3. Tiny 4 mm nonspecific hypodensity at the right hepatic lobe. This is likely benign, and is grossly unchanged from February. Electronically Signed   By: Garald Balding M.D.   On: 12/27/2016 00:01     Discharge Exam: Vitals:   12/29/16 0518 12/29/16 0843  BP: 139/78 (!) 147/68  Pulse: 76 74  Resp: 16 16  Temp: 97.8 F (36.6 C) 98.5 F (36.9 C)   Vitals:   12/28/16 1659 12/28/16 2110 12/29/16 0518 12/29/16 0843  BP: (!) 147/66 123/69 139/78 (!) 147/68  Pulse: 66 67 76 74  Resp: 14 15 16 16   Temp: 98.2 F (36.8 C) 98.5 F (36.9 C) 97.8 F (36.6 C) 98.5 F (36.9 C)  TempSrc: Oral Oral Oral Oral  SpO2: 100% 100% 99% 99%  Weight:  60.8 kg (134 lb)    Height:        General: Pt is alert, follows commands appropriately, not in acute distress Cardiovascular: Regular rate and rhythm, S1/S2 +, no murmurs, no rubs, no gallops Respiratory: Clear to auscultation bilaterally, no wheezing, no crackles, no rhonchi Abdominal: Soft, non tender, non distended, bowel sounds +, no guarding Extremities: no edema, no cyanosis, pulses palpable bilaterally DP and PT Neuro: Grossly nonfocal  Discharge Instructions  Discharge Instructions    Diet - low sodium heart healthy    Complete by:  As directed    Increase activity slowly    Complete by:  As directed      Allergies as of 12/29/2016  Reactions   Aspirin Nausea Only   Sick to stomach   Ibuprofen Nausea Only   Sick to stomach      Medication List    STOP taking these medications   bisoprolol-hydrochlorothiazide 5-6.25 MG tablet Commonly known as:  ZIAC   zolpidem 10 MG tablet Commonly known as:  AMBIEN     TAKE these medications   calcium carbonate 600 MG Tabs tablet Commonly known as:  OS-CAL Take 600 mg by mouth 2 (two) times daily with a meal.   celecoxib 200 MG capsule Commonly known as:  CELEBREX Take 200 mg by mouth 2  (two) times daily.   cholecalciferol 1000 units tablet Commonly known as:  VITAMIN D Take 1,000 Units by mouth daily.   cyclobenzaprine 10 MG tablet Commonly known as:  FLEXERIL Take 10 mg by mouth 3 (three) times daily as needed for muscle spasms.   doxycycline 100 MG tablet Commonly known as:  VIBRA-TABS Take 1 tablet (100 mg total) by mouth 2 (two) times daily.   escitalopram 5 MG tablet Commonly known as:  LEXAPRO Take 1 tablet (5 mg total) by mouth daily.   levothyroxine 88 MCG tablet Commonly known as:  SYNTHROID, LEVOTHROID Take 88 mcg by mouth daily.   losartan 100 MG tablet Commonly known as:  COZAAR Take 100 mg by mouth daily.   metFORMIN 500 MG 24 hr tablet Commonly known as:  GLUCOPHAGE-XR Take 500 mg by mouth daily.   metoprolol succinate 25 MG 24 hr tablet Commonly known as:  TOPROL-XL Take 1 tablet (25 mg total) by mouth daily.   mirtazapine 7.5 MG tablet Commonly known as:  REMERON Take 1 tablet (7.5 mg total) by mouth at bedtime.   multivitamin tablet Take 1 tablet by mouth daily.   saccharomyces boulardii 250 MG capsule Commonly known as:  FLORASTOR Take 1 capsule (250 mg total) by mouth 2 (two) times daily.   simvastatin 80 MG tablet Commonly known as:  ZOCOR Take 80 mg by mouth at bedtime.   traMADol 50 MG tablet Commonly known as:  ULTRAM Take 1 tablet (50 mg total) by mouth every 6 (six) hours as needed (for pain).   VITAMIN C PO Take 1 tablet by mouth daily.      Follow-up Information    Fransico Him, MD Follow up on 01/28/2017.   Specialty:  Cardiology Why:  [facility] Follow-Up on 01/28/2017 at 11:00AM. Contact information: [address] [address] [phone]        Shirline Frees, MD Follow up.   Specialty:  Family Medicine Contact information: [place] [address] [phone]            The results of significant diagnostics from this  hospitalization (including imaging, microbiology, ancillary and laboratory) are listed below for reference.     Microbiology: Recent Results (from the past 240 hour(s))  Blood Culture (routine x 2)     Status: None (Preliminary result)   Collection Time: 12/26/16  8:22 PM  Result Value Ref Range Status   Specimen Description BLOOD RIGHT ANTECUBITAL  Final   Special Requests   Final    BOTTLES DRAWN AEROBIC AND ANAEROBIC Blood Culture adequate volume   Culture NO GROWTH 3 DAYS  Final   Report Status PENDING  Incomplete  Urine culture     Status: Abnormal   Collection Time: 12/26/16  9:15 PM  Result Value Ref Range Status   Specimen Description URINE,  CLEAN CATCH  Final   Special Requests NONE  Final   Culture MULTIPLE SPECIES PRESENT, SUGGEST RECOLLECTION (A)  Final   Report Status 12/28/2016 FINAL  Final  Blood Culture (routine x 2)     Status: None (Preliminary result)   Collection Time: 12/26/16 10:22 PM  Result Value Ref Range Status   Specimen Description BLOOD LEFT ANTECUBITAL  Final   Special Requests   Final    BOTTLES DRAWN AEROBIC AND ANAEROBIC Blood Culture adequate volume   Culture NO GROWTH 3 DAYS  Final   Report Status PENDING  Incomplete     Labs: Basic Metabolic Panel:  Recent Labs Lab 12/26/16 2102 12/27/16 0430 12/27/16 0729 12/28/16 0553 12/28/16 0957 12/29/16 0409  NA 142  --  140 140  --  143  K 4.8  --  3.1* 3.2*  --  4.7  CL 111  --  111 109  --  113*  CO2 13*  --  18* 21*  --  19*  GLUCOSE 183*  --  115* 121*  --  103*  BUN 36*  --  22* 10  --  6  CREATININE 0.92 0.65 0.54 0.41*  --  0.39*  CALCIUM 10.4*  --  8.8* 9.2  --  9.5  MG  --   --   --   --  1.7  --    Liver Function Tests:  Recent Labs Lab 12/26/16 2102  AST 31  ALT 30  ALKPHOS 67  BILITOT 2.0*  PROT 7.2  ALBUMIN 4.1    Recent Labs Lab 12/26/16 2102  LIPASE 40   CBC:  Recent Labs Lab 12/26/16 2102 12/27/16 0430 12/27/16 0729 12/28/16 0553 12/29/16 0409  WBC  10.8* 14.2* 12.9* 5.2 5.5  HGB 18.2* 14.7 14.1 12.7 12.9  HCT 52.2* 42.7 40.6 37.4 37.1  MCV 90.9 90.9 90.2 89.0 89.6  PLT 284 187 189 143* PLATELET CLUMPS NOTED ON SMEAR, COUNT APPEARS DECREASED   Cardiac Enzymes:  Recent Labs Lab 12/27/16 0430 12/27/16 0729 12/27/16 1448  TROPONINI 0.03* 0.03* <0.03   CBG:  Recent Labs Lab 12/28/16 0822 12/28/16 1118 12/28/16 1647 12/28/16 2106 12/29/16 0759  GLUCAP 124* 133* 206* 206* 108*   SIGNED: Time coordinating discharge: 30 minutes  Faye Ramsay, MD  [facility] 12/29/2016, 10:39 AM Pager [phone]  If 7PM-7AM, please contact night-coverage [email or web address] Password [email or web address]

## 2016-12-29 NOTE — Care Management Note (Signed)
Case Management Note  Patient Details  Name: Carrie Macias MRN: [id number] Date of Birth: 1956-03-04  Subjective/Objective:        CM following for progression and d/c planning.             Action/Plan: 12/29/2016 Met with pt re followup psy care. Pt has agreed to outpatient visit at [facility] with a therapist. This CM called that center and scheduled an appointment for April 25,2018 at 10am. This info was placed in pt d/c instructions.   Expected Discharge Date:  12/29/16               Expected Discharge Plan:  Home/Self Care  In-House Referral:  NA  Discharge planning Services  CM Consult, Follow-up appt scheduled  Post Acute Care Choice:  NA Choice offered to:  Patient  DME Arranged:  N/A DME Agency:  NA  HH Arranged:  NA HH Agency:  NA  Status of Service:  Completed, signed off  If discussed at [place] of Stay Meetings, dates discussed:    Additional Comments:  Adron Bene, RN 12/29/2016, 12:51 PM

## 2016-12-29 NOTE — Progress Notes (Signed)
Carrie Macias to be D/C'd Home per MD order.  Discussed prescriptions and follow up appointments with the patient. Prescriptions given to patient, medication list explained in detail. Pt verbalized understanding.  Allergies as of 12/29/2016      Reactions   Aspirin Nausea Only   Sick to stomach   Ibuprofen Nausea Only   Sick to stomach      Medication List    STOP taking these medications   bisoprolol-hydrochlorothiazide 5-6.25 MG tablet Commonly known as:  ZIAC   zolpidem 10 MG tablet Commonly known as:  AMBIEN     TAKE these medications   calcium carbonate 600 MG Tabs tablet Commonly known as:  OS-CAL Take 600 mg by mouth 2 (two) times daily with a meal.   celecoxib 200 MG capsule Commonly known as:  CELEBREX Take 200 mg by mouth 2 (two) times daily.   cholecalciferol 1000 units tablet Commonly known as:  VITAMIN D Take 1,000 Units by mouth daily.   cyclobenzaprine 10 MG tablet Commonly known as:  FLEXERIL Take 10 mg by mouth 3 (three) times daily as needed for muscle spasms.   doxycycline 100 MG tablet Commonly known as:  VIBRA-TABS Take 1 tablet (100 mg total) by mouth 2 (two) times daily.   escitalopram 5 MG tablet Commonly known as:  LEXAPRO Take 1 tablet (5 mg total) by mouth daily.   levothyroxine 88 MCG tablet Commonly known as:  SYNTHROID, LEVOTHROID Take 88 mcg by mouth daily.   losartan 100 MG tablet Commonly known as:  COZAAR Take 100 mg by mouth daily.   metFORMIN 500 MG 24 hr tablet Commonly known as:  GLUCOPHAGE-XR Take 500 mg by mouth daily.   metoprolol succinate 25 MG 24 hr tablet Commonly known as:  TOPROL-XL Take 1 tablet (25 mg total) by mouth daily.   mirtazapine 7.5 MG tablet Commonly known as:  REMERON Take 1 tablet (7.5 mg total) by mouth at bedtime.   multivitamin tablet Take 1 tablet by mouth daily.   saccharomyces boulardii 250 MG capsule Commonly known as:  FLORASTOR Take 1 capsule (250 mg total) by mouth 2 (two)  times daily.   simvastatin 80 MG tablet Commonly known as:  ZOCOR Take 80 mg by mouth at bedtime.   traMADol 50 MG tablet Commonly known as:  ULTRAM Take 1 tablet (50 mg total) by mouth every 6 (six) hours as needed (for pain).   VITAMIN C PO Take 1 tablet by mouth daily.       Vitals:   12/29/16 0518 12/29/16 0843  BP: 139/78 (!) 147/68  Pulse: 76 74  Resp: 16 16  Temp: 97.8 F (36.6 C) 98.5 F (36.9 C)    Skin clean, dry and intact without evidence of skin break down, no evidence of skin tears noted. IV catheter discontinued intact. Site without signs and symptoms of complications. Dressing and pressure applied. Pt denies pain at this time. No complaints noted.  An After Visit Summary was printed and given to the patient. Patient escorted via [place], and D/C home via private auto.  Dixie Dials RN, BSN

## 2016-12-31 LAB — CULTURE, BLOOD (ROUTINE X 2)
CULTURE: NO GROWTH
CULTURE: NO GROWTH
SPECIAL REQUESTS: ADEQUATE
Special Requests: ADEQUATE

## 2017-01-07 DIAGNOSIS — E7800 Pure hypercholesterolemia, unspecified: Secondary | ICD-10-CM | POA: Diagnosis not present

## 2017-01-07 DIAGNOSIS — E039 Hypothyroidism, unspecified: Secondary | ICD-10-CM | POA: Diagnosis not present

## 2017-01-07 DIAGNOSIS — F324 Major depressive disorder, single episode, in partial remission: Secondary | ICD-10-CM | POA: Diagnosis not present

## 2017-01-07 DIAGNOSIS — E119 Type 2 diabetes mellitus without complications: Secondary | ICD-10-CM | POA: Diagnosis not present

## 2017-01-11 ENCOUNTER — Encounter (HOSPITAL_COMMUNITY): Payer: Self-pay | Admitting: Emergency Medicine

## 2017-01-11 ENCOUNTER — Emergency Department (HOSPITAL_COMMUNITY)
Admission: EM | Admit: 2017-01-11 | Discharge: 2017-01-12 | Disposition: A | Discharge status: Other Acute Inpatient Hospital | Payer: BLUE CROSS/BLUE SHIELD | Attending: Physician Assistant | Admitting: Physician Assistant

## 2017-01-11 DIAGNOSIS — F332 Major depressive disorder, recurrent severe without psychotic features: Secondary | ICD-10-CM | POA: Insufficient documentation

## 2017-01-11 DIAGNOSIS — E1165 Type 2 diabetes mellitus with hyperglycemia: Secondary | ICD-10-CM

## 2017-01-11 DIAGNOSIS — Z046 Encounter for general psychiatric examination, requested by authority: Secondary | ICD-10-CM

## 2017-01-11 DIAGNOSIS — R45851 Suicidal ideations: Secondary | ICD-10-CM

## 2017-01-11 DIAGNOSIS — J45909 Unspecified asthma, uncomplicated: Secondary | ICD-10-CM | POA: Insufficient documentation

## 2017-01-11 DIAGNOSIS — Z7984 Long term (current) use of oral hypoglycemic drugs: Secondary | ICD-10-CM | POA: Insufficient documentation

## 2017-01-11 DIAGNOSIS — I10 Essential (primary) hypertension: Secondary | ICD-10-CM | POA: Diagnosis not present

## 2017-01-11 DIAGNOSIS — R258 Other abnormal involuntary movements: Secondary | ICD-10-CM | POA: Diagnosis not present

## 2017-01-11 DIAGNOSIS — E119 Type 2 diabetes mellitus without complications: Secondary | ICD-10-CM | POA: Diagnosis not present

## 2017-01-11 DIAGNOSIS — Z85850 Personal history of malignant neoplasm of thyroid: Secondary | ICD-10-CM | POA: Insufficient documentation

## 2017-01-11 DIAGNOSIS — Z79899 Other long term (current) drug therapy: Secondary | ICD-10-CM | POA: Insufficient documentation

## 2017-01-11 LAB — COMPREHENSIVE METABOLIC PANEL
ALT: 26 U/L (ref 14–54)
ANION GAP: 8 (ref 5–15)
AST: 27 U/L (ref 15–41)
Albumin: 3.7 g/dL (ref 3.5–5.0)
Alkaline Phosphatase: 53 U/L (ref 38–126)
BUN: 9 mg/dL (ref 6–20)
CALCIUM: 9.5 mg/dL (ref 8.9–10.3)
CHLORIDE: 107 mmol/L (ref 101–111)
CO2: 26 mmol/L (ref 22–32)
CREATININE: 0.68 mg/dL (ref 0.44–1.00)
GFR calc non Af Amer: >60 mL/min (ref >60)
Glucose, Bld: 159 mg/dL — ABNORMAL HIGH (ref 65–99)
Potassium: 3.7 mmol/L (ref 3.5–5.1)
SODIUM: 141 mmol/L (ref 135–145)
Total Bilirubin: 0.8 mg/dL (ref 0.3–1.2)
Total Protein: 6.2 g/dL — ABNORMAL LOW (ref 6.5–8.1)

## 2017-01-11 LAB — CBC
HCT: 43.1 % (ref 36.0–46.0)
HEMOGLOBIN: 14.7 g/dL (ref 12.0–15.0)
MCH: 30.6 pg (ref 26.0–34.0)
MCHC: 34.1 g/dL (ref 30.0–36.0)
MCV: 89.8 fL (ref 78.0–100.0)
PLATELETS: 209 10*3/uL (ref 150–400)
RBC: 4.8 MIL/uL (ref 3.87–5.11)
RDW: 12.4 % (ref 11.5–15.5)
WBC: 8.2 10*3/uL (ref 4.0–10.5)

## 2017-01-11 LAB — SALICYLATE LEVEL

## 2017-01-11 LAB — RAPID URINE DRUG SCREEN, HOSP PERFORMED
AMPHETAMINES: NOT DETECTED
Barbiturates: NOT DETECTED
Benzodiazepines: NOT DETECTED
Cocaine: NOT DETECTED
OPIATES: NOT DETECTED
TETRAHYDROCANNABINOL: NOT DETECTED

## 2017-01-11 LAB — ACETAMINOPHEN LEVEL: Acetaminophen (Tylenol), Serum: <10 ug/mL — ABNORMAL LOW (ref 10–30)

## 2017-01-11 LAB — ETHANOL

## 2017-01-11 MED ORDER — ZOLPIDEM TARTRATE 5 MG PO TABS: 5.0000 mg | ORAL_TABLET | Freq: Every evening | ORAL | Status: DC | PRN

## 2017-01-11 MED ORDER — LEVOTHYROXINE SODIUM 88 MCG PO TABS
88.0000 ug | ORAL_TABLET | Freq: Every day | ORAL | Status: DC
  Filled 2017-01-11: qty 1

## 2017-01-11 MED ORDER — ONDANSETRON HCL 4 MG PO TABS: 4.0000 mg | ORAL_TABLET | Freq: Three times a day (TID) | ORAL | Status: DC | PRN

## 2017-01-11 MED ORDER — ALUM & MAG HYDROXIDE-SIMETH 200-200-20 MG/5ML PO SUSP: 30.0000 mL | ORAL | Status: DC | PRN

## 2017-01-11 MED ORDER — MIRTAZAPINE 7.5 MG PO TABS
7.5000 mg | ORAL_TABLET | Freq: Every day | ORAL | Status: DC
  Administered 2017-01-11: 7.5 mg via ORAL
  Filled 2017-01-11: qty 1

## 2017-01-11 MED ORDER — CALCIUM CARBONATE 1250 (500 CA) MG PO TABS
1250.0000 mg | ORAL_TABLET | Freq: Two times a day (BID) | ORAL | Status: DC
  Filled 2017-01-11: qty 1

## 2017-01-11 MED ORDER — ADULT MULTIVITAMIN W/MINERALS CH
1.0000 | ORAL_TABLET | Freq: Every day | ORAL | Status: DC
  Administered 2017-01-11: 1 via ORAL
  Filled 2017-01-11: qty 1

## 2017-01-11 MED ORDER — LOSARTAN POTASSIUM 50 MG PO TABS
100.0000 mg | ORAL_TABLET | Freq: Every day | ORAL | Status: DC
  Filled 2017-01-11: qty 2

## 2017-01-11 MED ORDER — ESCITALOPRAM OXALATE 10 MG PO TABS
5.0000 mg | ORAL_TABLET | Freq: Every day | ORAL | Status: DC
  Administered 2017-01-11: 5 mg via ORAL
  Filled 2017-01-11: qty 1

## 2017-01-11 MED ORDER — METFORMIN HCL ER 500 MG PO TB24
500.0000 mg | ORAL_TABLET | Freq: Every day | ORAL | Status: DC
  Administered 2017-01-11: 500 mg via ORAL
  Filled 2017-01-11: qty 1

## 2017-01-11 MED ORDER — LORAZEPAM 1 MG PO TABS: 1.0000 mg | ORAL_TABLET | Freq: Three times a day (TID) | ORAL | Status: DC | PRN

## 2017-01-11 MED ORDER — VITAMIN D 1000 UNITS PO TABS
1000.0000 [IU] | ORAL_TABLET | Freq: Every day | ORAL | Status: DC
  Administered 2017-01-11: 1000 [IU] via ORAL
  Filled 2017-01-11: qty 1

## 2017-01-11 MED ORDER — METOPROLOL SUCCINATE ER 25 MG PO TB24
25.0000 mg | ORAL_TABLET | Freq: Every day | ORAL | Status: DC
  Administered 2017-01-11: 25 mg via ORAL
  Filled 2017-01-11: qty 1

## 2017-01-11 MED ORDER — ACETAMINOPHEN 325 MG PO TABS: 650.0000 mg | ORAL_TABLET | ORAL | Status: DC | PRN

## 2017-01-11 MED ORDER — ATORVASTATIN CALCIUM 40 MG PO TABS
40.0000 mg | ORAL_TABLET | Freq: Every day | ORAL | Status: DC
  Administered 2017-01-11: 40 mg via ORAL
  Filled 2017-01-11: qty 1

## 2017-01-11 MED ORDER — TRAMADOL HCL 50 MG PO TABS: 50.0000 mg | ORAL_TABLET | Freq: Four times a day (QID) | ORAL | Status: DC | PRN

## 2017-01-11 MED ORDER — IBUPROFEN 200 MG PO TABS: 600.0000 mg | ORAL_TABLET | Freq: Three times a day (TID) | ORAL | Status: DC | PRN

## 2017-01-11 MED ORDER — VITAMIN C 500 MG PO TABS
1000.0000 mg | ORAL_TABLET | Freq: Every day | ORAL | Status: DC
  Administered 2017-01-11: 1000 mg via ORAL
  Filled 2017-01-11: qty 2

## 2017-01-11 NOTE — ED Notes (Signed)
Attempted x 2 to draw labs without success.  Requested [place] to draw.

## 2017-01-11 NOTE — ED Notes (Signed)
Pt stated "my sister came down and she told me I was going to [facility].  I told her I didn't need to go.  I told her I'm going to quit eating or drinking.  I just don't want to be here anymore."

## 2017-01-11 NOTE — ED Notes (Signed)
Mercedes, PA-C informed of pt's blood glucose & b/p.  Received verbal order to hold losartan but give the metformin & metoprolol.

## 2017-01-11 NOTE — ED Triage Notes (Signed)
Patient from home with depression is here IVC'd today by her sister for having suicidal thoughts and a plan to overdose on pills or cut her wrists.  Patient has a very flat affect and reports she has been depressed since losing her husband five years ago and an adult daughter who died a year later.  No visual or auditory hallucinations reported.

## 2017-01-11 NOTE — ED Notes (Signed)
Bed: [facility] Expected date:  Expected time:  Means of arrival:  Comments: 

## 2017-01-11 NOTE — ED Notes (Signed)
[facility] requesting IVC paperwork be faxed.  Faxed to [phone].

## 2017-01-11 NOTE — BH Assessment (Addendum)
Assessment Note  Carrie Macias is an 61 y.o. female. She presents to [facility] BIB [facility]. Patient IVC'd by her sister Arelia Sneddon). The IVC sts, "The petitioner has been diagnosed with depression. She has attempted suicide 2x's by drug overdose or pills and by cutting her wrist with a knife. Recently she has been stating that she wants to die. She has made comments that if she had a gun she would shoot herself. She doesn't eat or take care of her person hygiene. The respondent has a dog she recently gave away. This morning she told the petitioner to leave her house she wants to die and she is not going to continue to seek the doctor or take of herself".   Patient continues to endorse suicidal ideations. Sts, "I just want to go to sleep and never wake up anymore". She is unable to contract for safety. She doesn't have a plan. Sts, "I don't know what I'll do". She has a history of 2 suicide attempts. No self mutilating behaviors. She identifies her loss (spouse and son) as triggers. No family history of mental health illness reported. No HI. No AVH's. No alcohol or drug use reported. She has a hx of 1 prior INPT admission to [facility] . She does not have a psychiatrist or therapist.     Diagnosis: Major Depressive Disorder, Recurrent, Severe, without psychotic features and Anxiety Disorder  Past Medical History:  Past Medical History:  Diagnosis Date  . Allergy   . Anxiety   . Asthma   . Back pain, chronic   . Cancer (HCC)    THYROID  . Depression   . Detrusor dyssynergia   . Diabetes mellitus    TYPE II  . GERD (gastroesophageal reflux disease)   . Heart murmur   . Hormone disorder    HYPOTHYROIDISM  . Hypercholesterolemia   . Hypertension   . Thyroid disease     Past Surgical History:  Procedure Laterality Date  . ABDOMINAL HYSTERECTOMY     LAVH/RSO/SLING/ANT/COLPO  . COLONOSCOPY  07/16/2010  . EXTERNAL EAR SURGERY    . INNER EAR SURGERY     IMPLANT  . TONSILLECTOMY AND  ADENOIDECTOMY    . TUBAL LIGATION      Family History:  Family History  Problem Relation Age of Onset  . Diabetes Mother   . Hypertension Mother   . Heart disease Mother   . Fibromyalgia Mother   . Congestive Heart Failure Mother   . Hypertension Father   . Congestive Heart Failure Father   . Hypertension Sister   . COPD Sister   . OCD Sister   . Fibromyalgia Sister   . Bipolar disorder Sister   . Bipolar disorder Brother   . Bipolar disorder Sister   . Bipolar disorder Maternal Grandfather     Social History:  reports that she has never smoked. She has never used smokeless tobacco. She reports that she drinks alcohol. She reports that she does not use drugs.  Additional Social History:  Alcohol / Drug Use Pain Medications: See MAR Prescriptions: See MAR Over the Counter: See MAR History of alcohol / drug use?: No history of alcohol / drug abuse  CIWA: CIWA-Ar BP: 134/64 Pulse Rate: 61 COWS:    Allergies:  Allergies  Allergen Reactions  . Aspirin Nausea Only    Sick to stomach  . Ibuprofen Nausea Only    Sick to stomach    Home Medications:  (Not in a hospital admission)  OB/GYN  Status:  No LMP recorded. Patient has had a hysterectomy.  General Assessment Data Location of Assessment: [facility] ED TTS Assessment: In system Is this a Tele or Face-to-Face Assessment?: Face-to-Face Is this an Initial Assessment or a Re-assessment for this encounter?: Initial Assessment Marital status: Widowed Elwin Sleight name:  Jerelene Redden) Is patient pregnant?: No Pregnancy Status: No Living Arrangements: Other (Comment), Other relatives ("I live with my little sister") Can pt return to current living arrangement?: Yes Admission Status: Involuntary Is patient capable of signing voluntary admission?: Yes Referral Source: Self/Family/Friend Insurance type:  Nurse, mental health)     Crisis Care Plan Living Arrangements: Other (Comment), Other relatives ("I live with my little sister") Legal  Guardian: Other: Name of Psychiatrist: NA Name of Therapist: NA  Education Status Is patient currently in school?: No Current Grade:  (n/a) Highest grade of school patient has completed: GED Name of school: NA Contact person: NA  Risk to self with the past 6 months Suicidal Ideation: Yes-Currently Present Has patient been a risk to self within the past 6 months prior to admission? : Yes Suicidal Intent: Yes-Currently Present Has patient had any suicidal intent within the past 6 months prior to admission? : Yes Suicidal Plan?: Yes-Currently Present Specify Current Suicidal Plan:  (no plan ) Access to Means: Yes Specify Access to Suicidal Means:  (patient has knives at home ) What has been your use of drugs/alcohol within the last 12 months?:  (denies ) Previous Attempts/Gestures: Yes ("I tried to cut my wrist") How many times?:  (2x's; "I loss so much weight") Other Self Harm Risks:  (none reported ) Triggers for Past Attempts: Other (Comment) (n/a) Intentional Self Injurious Behavior: None Comment - Self Injurious Behavior:  (denies) Recent stressful life event(s): Other (Comment) (loosing a son, daughter...hx of cancer) Persecutory voices/beliefs?: No Depression: Yes Depression Symptoms: Feeling worthless/self pity, Feeling angry/irritable, Loss of interest in usual pleasures, Fatigue, Guilt, Isolating, Tearfulness Substance abuse history and/or treatment for substance abuse?: No Suicide prevention information given to non-admitted patients: Not applicable  Risk to Others within the past 6 months Homicidal Ideation: No Does patient have any lifetime risk of violence toward others beyond the six months prior to admission? : No Thoughts of Harm to Others: No Current Homicidal Intent: No Current Homicidal Plan: No Access to Homicidal Means: No Identified Victim:  (no HI) History of harm to others?: No Assessment of Violence: None Noted Violent Behavior Description:   (currently calm and cooperative ) Does patient have access to weapons?: No Criminal Charges Pending?: No Does patient have a court date: No Is patient on probation?: No  Psychosis Hallucinations: None noted Delusions: None noted  Mental Status Report Appearance/Hygiene: In scrubs Eye Contact: Fair Motor Activity: Freedom of movement Speech: Logical/coherent Level of Consciousness: Alert Mood: Depressed Affect: Appropriate to circumstance Anxiety Level: None Thought Processes: Coherent, Relevant Judgement: Unimpaired Orientation: Person, Place, Time, Situation Obsessive Compulsive Thoughts/Behaviors: None  Cognitive Functioning Memory: Recent Intact, Remote Intact IQ: Average Insight: Poor Impulse Control: Poor Appetite: Poor Weight Loss:  (70+ pounds since September 2017) Weight Gain:  (none reported) Sleep: Decreased Total Hours of Sleep:  ("I don't sleep at all") Vegetative Symptoms: Decreased grooming, Not bathing, Staying in bed  ADLScreening [facility] Assessment Services) Patient's cognitive ability adequate to safely complete daily activities?: Yes Patient able to express need for assistance with ADLs?: Yes Independently performs ADLs?: Yes (appropriate for developmental age)  Prior Inpatient Therapy Prior Inpatient Therapy: Yes Prior Therapy Dates: Na ("It was a few months ago.Marland KitchenI  can't really recall") Prior Therapy Facilty/Provider(s): NA ([facility]) Reason for Treatment: NA (depression and suicidal ideations )  Prior Outpatient Therapy Prior Outpatient Therapy: No Prior Therapy Dates: NA Prior Therapy Facilty/Provider(s): NA Reason for Treatment: NA Does patient have an ACCT team?: No Does patient have Intensive In-House Services?  : No Does patient have [facility] services? : No Does patient have P4CC services?: No  ADL Screening (condition at time of admission) Patient's cognitive ability adequate to safely complete daily activities?: Yes Is the patient  deaf or have difficulty hearing?: No Does the patient have difficulty seeing, even when wearing glasses/contacts?: No Does the patient have difficulty concentrating, remembering, or making decisions?: No Patient able to express need for assistance with ADLs?: Yes Does the patient have difficulty dressing or bathing?: No Independently performs ADLs?: Yes (appropriate for developmental age) Does the patient have difficulty walking or climbing stairs?: No Weakness of Legs: None Weakness of Arms/Hands: None  Home Assistive Devices/Equipment Home Assistive Devices/Equipment: None          Advance Directives (For Healthcare) Does Patient Have a Medical Advance Directive?: No Would patient like information on creating a medical advance directive?: No - Patient declined Nutrition Screen- [place] Adult/[facility] Patient's home diet: Regular  Additional Information 1:1 In Past 12 Months?: No CIRT Risk: No Elopement Risk: No Does patient have medical clearance?: Yes     Disposition:  Disposition Initial Assessment Completed for this Encounter: Yes  On Site Evaluation by:   Reviewed with Physician:    Waldon Merl 01/11/2017 6:07 PM

## 2017-01-11 NOTE — ED Provider Notes (Signed)
[facility] DEPT Provider Note   CSN: [id number] Arrival date & time: 01/11/17  1542     History   Chief Complaint Chief Complaint  Patient presents with  . Suicidal    HPI Carrie Macias is a 61 y.o. female with a PMHx of asthma, thyroid cancer, hypothyroidism, depression, DM2, GERD, HTN, and HLD, who presents to the ED under IVC after her sister took out IVC paperwork because patient has been having suicidal ideations with a plan to either overdose on pills or slit her wrists. Patient states that her depression has been gradually worsening over the course of the last few years since she lost her husband and daughter. She has recently been admitted for suicidal ideations. She reports occasional noncompliance with her medications, can't recall all of her medications but she is able to confirm the medications listed on her MAR. She has not yet established outpatient psychiatric care, she thinks that she was told to go tomorrow but she has not yet made any appointments. She denies HI/AVH, alcohol use, illicit drug use, or being a cigarette smoker. She denies any other medical complaints at this time.   The history is provided by the patient and medical records. No language interpreter was used.  Mental Health Problem  Presenting symptoms: depression and suicidal thoughts   Presenting symptoms: no hallucinations and no homicidal ideas   Patient accompanied by:  Family member Onset quality:  Gradual Duration: years. Timing:  Constant Progression:  Unchanged Chronicity:  Chronic Context: noncompliance   Treatment compliance:  Some of the time Relieved by:  None tried Worsened by:  Nothing Ineffective treatments:  None tried Associated symptoms: no abdominal pain and no chest pain   Risk factors: hx of mental illness, hx of suicide attempts and recent psychiatric admission     Past Medical History:  Diagnosis Date  . Allergy   . Anxiety   . Asthma   . Back pain, chronic   .  Cancer (HCC)    THYROID  . Depression   . Detrusor dyssynergia   . Diabetes mellitus    TYPE II  . GERD (gastroesophageal reflux disease)   . Heart murmur   . Hormone disorder    HYPOTHYROIDISM  . Hypercholesterolemia   . Hypertension   . Thyroid disease     Patient Active Problem List   Diagnosis Date Noted  . Sepsis ([place]) 12/27/2016  . 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 Protein-calorie malnutrition, severe 12/27/2016  . Hypertension   . Thyroid disease   . SVT (supraventricular tachycardia) ([place])   . Depression with suicidal ideation 12/13/2016  . Female cystocele 02/07/2014  . History of vitamin D deficiency 01/27/2012  . Hypercholesterolemia   . Back pain, chronic     Past Surgical History:  Procedure Laterality Date  . ABDOMINAL HYSTERECTOMY     LAVH/RSO/SLING/ANT/COLPO  . COLONOSCOPY  07/16/2010  . EXTERNAL EAR SURGERY    . INNER EAR SURGERY     IMPLANT  . TONSILLECTOMY AND ADENOIDECTOMY    . TUBAL LIGATION      OB History    Gravida Para Term Preterm AB Living   3 2 2   1 2    SAB TAB Ectopic Multiple Live Births   1       2       Home Medications    Prior to Admission medications   Medication Sig Start Date End Date Taking? Authorizing Provider  Ascorbic Acid (VITAMIN C PO) Take 1 tablet by  mouth daily.    Historical Provider, MD  calcium carbonate (OS-CAL) 600 MG TABS Take 600 mg by mouth 2 (two) times daily with a meal.    Historical Provider, MD  celecoxib (CELEBREX) 200 MG capsule Take 200 mg by mouth 2 (two) times daily.    Historical Provider, MD  cholecalciferol (VITAMIN D) 1000 UNITS tablet Take 1,000 Units by mouth daily.    Historical Provider, MD  cyclobenzaprine (FLEXERIL) 10 MG tablet Take 10 mg by mouth 3 (three) times daily as needed for muscle spasms.     Historical Provider, MD  doxycycline (VIBRA-TABS) 100 MG tablet Take 1 tablet (100 mg total) by mouth 2 (two) times daily. 12/29/16   Theodis Blaze, MD  escitalopram (LEXAPRO) 5 MG  tablet Take 1 tablet (5 mg total) by mouth daily. 12/29/16   Theodis Blaze, MD  levothyroxine (SYNTHROID, LEVOTHROID) 88 MCG tablet Take 88 mcg by mouth daily.     Historical Provider, MD  losartan (COZAAR) 100 MG tablet Take 100 mg by mouth daily.     Historical Provider, MD  metFORMIN (GLUCOPHAGE-XR) 500 MG 24 hr tablet Take 500 mg by mouth daily.  11/19/16   Historical Provider, MD  metoprolol succinate (TOPROL-XL) 25 MG 24 hr tablet Take 1 tablet (25 mg total) by mouth daily. 12/29/16   Theodis Blaze, MD  mirtazapine (REMERON) 7.5 MG tablet Take 1 tablet (7.5 mg total) by mouth at bedtime. 12/29/16   Theodis Blaze, MD  Multiple Vitamin (MULTIVITAMIN) tablet Take 1 tablet by mouth daily.    Historical Provider, MD  saccharomyces boulardii (FLORASTOR) 250 MG capsule Take 1 capsule (250 mg total) by mouth 2 (two) times daily. 12/29/16   Theodis Blaze, MD  simvastatin (ZOCOR) 80 MG tablet Take 80 mg by mouth at bedtime.    Historical Provider, MD  traMADol (ULTRAM) 50 MG tablet Take 1 tablet (50 mg total) by mouth every 6 (six) hours as needed (for pain). 12/29/16   Theodis Blaze, MD    Family History Family History  Problem Relation Age of Onset  . Diabetes Mother   . Hypertension Mother   . Heart disease Mother   . Fibromyalgia Mother   . Congestive Heart Failure Mother   . Hypertension Father   . Congestive Heart Failure Father   . Hypertension Sister   . COPD Sister   . OCD Sister   . Fibromyalgia Sister   . Bipolar disorder Sister   . Bipolar disorder Brother   . Bipolar disorder Sister   . Bipolar disorder Maternal Grandfather     Social History Social History  Substance Use Topics  . Smoking status: Never Smoker  . Smokeless tobacco: Never Used  . Alcohol use 0.0 oz/week     Comment: SELDOM     Allergies   Aspirin and Ibuprofen   Review of Systems Review of Systems  Constitutional: Negative for chills and fever.  Respiratory: Negative for shortness of breath.     Cardiovascular: Negative for chest pain.  Gastrointestinal: Negative for abdominal pain, constipation, diarrhea, nausea and vomiting.  Genitourinary: Negative for dysuria and hematuria.  Musculoskeletal: Negative for arthralgias and myalgias.  Skin: Negative for color change.  Allergic/Immunologic: Positive for immunocompromised state (DM2).  Neurological: Negative for weakness and numbness.  Psychiatric/Behavioral: Positive for suicidal ideas. Negative for confusion, hallucinations and homicidal ideas.   All other systems reviewed and are negative for acute change except as noted in the HPI.  Physical Exam Updated Vital Signs BP 134/64 (BP Location: Left Arm)   Pulse 61   Temp 97.9 F (36.6 C) (Oral)   Resp 16   Ht 5\' 4"  (1.626 m)   Wt 60.8 kg   SpO2 100%   BMI 23.00 kg/m   Physical Exam  Constitutional: She is oriented to person, place, and time. Vital signs are normal. She appears well-developed and well-nourished.  Non-toxic appearance. No distress.  Afebrile, nontoxic, NAD  HENT:  Head: Normocephalic and atraumatic.  Mouth/Throat: Oropharynx is clear and moist and mucous membranes are normal.  Eyes: Conjunctivae and EOM are normal. Right eye exhibits no discharge. Left eye exhibits no discharge.  Neck: Normal range of motion. Neck supple.  Cardiovascular: Normal rate, regular rhythm, normal heart sounds and intact distal pulses.  Exam reveals no gallop and no friction rub.   No murmur heard. Pulmonary/Chest: Effort normal and breath sounds normal. No respiratory distress. She has no decreased breath sounds. She has no wheezes. She has no rhonchi. She has no rales.  Abdominal: Soft. Normal appearance and bowel sounds are normal. She exhibits no distension. There is no tenderness. There is no rigidity, no rebound, no guarding, no CVA tenderness, no tenderness at McBurney's point and negative Murphy's sign.  Musculoskeletal: Normal range of motion.  Neurological: She is  alert and oriented to person, place, and time. She has normal strength. No sensory deficit.  Skin: Skin is warm, dry and intact. No rash noted.  Psychiatric: Her affect is blunt. She is not actively hallucinating. She exhibits a depressed mood. She expresses suicidal ideation. She expresses no homicidal ideation. She expresses suicidal plans. She expresses no homicidal plans.  Flat and depressed affect, endorsing SI with plan; denies HI/AVH. Doesn't seem to be responding to internal stimuli  Nursing note and vitals reviewed.    ED Treatments / Results  Labs (all labs ordered are listed, but only abnormal results are displayed) Labs Reviewed  COMPREHENSIVE METABOLIC PANEL - Abnormal; Notable for the following:       Result Value   Glucose, Bld 159 (*)    Total Protein 6.2 (*)    All other components within normal limits  ACETAMINOPHEN LEVEL - Abnormal; Notable for the following:    Acetaminophen (Tylenol), Serum <10 (*)    All other components within normal limits  ETHANOL  SALICYLATE LEVEL  CBC  RAPID URINE DRUG SCREEN, HOSP PERFORMED    EKG  EKG Interpretation None       Radiology No results found.  Procedures Procedures (including critical care time)  Medications Ordered in ED Medications  vitamin C (ASCORBIC ACID) tablet 1,000 mg (not administered)  calcium carbonate (OS-CAL - dosed in mg of elemental calcium) tablet 1,250 mg (not administered)  cholecalciferol (VITAMIN D) tablet 1,000 Units (not administered)  escitalopram (LEXAPRO) tablet 5 mg (5 mg Oral Given 01/11/17 1952)  levothyroxine (SYNTHROID, LEVOTHROID) tablet 88 mcg (not administered)  losartan (COZAAR) tablet 100 mg (not administered)  metFORMIN (GLUCOPHAGE-XR) 24 hr tablet 500 mg (not administered)  metoprolol succinate (TOPROL-XL) 24 hr tablet 25 mg (not administered)  mirtazapine (REMERON) tablet 7.5 mg (not administered)  multivitamin with minerals tablet 1 tablet (not administered)  atorvastatin  (LIPITOR) tablet 40 mg (not administered)  traMADol (ULTRAM) tablet 50 mg (not administered)  alum & mag hydroxide-simeth (MAALOX/MYLANTA) 200-200-20 MG/5ML suspension 30 mL (not administered)  ondansetron (ZOFRAN) tablet 4 mg (not administered)  zolpidem (AMBIEN) tablet 5 mg (not administered)  ibuprofen (ADVIL,MOTRIN) tablet 600 mg (  not administered)  acetaminophen (TYLENOL) tablet 650 mg (not administered)  LORazepam (ATIVAN) tablet 1 mg (not administered)     Initial Impression / Assessment and Plan / ED Course  I have reviewed the triage vital signs and the nursing notes.  Pertinent labs & imaging results that were available during my care of the patient were reviewed by me and considered in my medical decision making (see chart for details).     61 y.o. female here after her sister IVC'd her for her ongoing SI with a plan to OD on pills or slit her wrists. No current psychiatric care, was supposed to see someone soon but hasn't. On exam, very flat affect. Denies any other complaints, no HI/AVH, nonsmoker, no EtOH or drug use. Not very compliant with her medications, but going through [facility] she acknowledges that the meds listed are correct. Will get med clearance labs, TTS consult, and reassess after labs return.   9:25 PM Multiple delays in getting labs done, but they have finally resulted and are as follows: EtOH undetectable. CBC WNL. CMP with mildly elevated CBG 159, otherwise unremarkable. Salicylate and acetaminophen levels WNL. UDS without any substances found. Pt medically cleared at this time. Psych hold orders and home med orders placed. Please see TTS notes for further documentation of care/dispo. Pt stable at time of med clearance.     Final Clinical Impressions(s) / ED Diagnoses   Final diagnoses:  Suicidal ideations  Involuntary commitment  Type 2 diabetes mellitus with hyperglycemia, without long-term current use of insulin [facility])    New Prescriptions New  Prescriptions   No medications on file     [address] PA-C 01/11/17 2125    [place], MD 01/12/17 1607

## 2017-01-11 NOTE — ED Notes (Signed)
I attempted to collect labs twice and was unsuccessful 

## 2017-01-11 NOTE — ED Notes (Signed)
Called phlebotomy, [phone], spoke with Cyril Mourning.  Requested labs be drawn for Rm 26.

## 2017-01-12 ENCOUNTER — Inpatient Hospital Stay (HOSPITAL_COMMUNITY)
Admission: EM | Admit: 2017-01-12 | Discharge: 2017-01-17 | DRG: 885 | Disposition: A | Discharge status: Home / Self Care | Payer: BLUE CROSS/BLUE SHIELD | Source: Intra-hospital | Attending: Psychiatry | Admitting: Psychiatry

## 2017-01-12 ENCOUNTER — Ambulatory Visit (HOSPITAL_COMMUNITY): Payer: BLUE CROSS/BLUE SHIELD | Admitting: Licensed Clinical Social Worker

## 2017-01-12 ENCOUNTER — Encounter (HOSPITAL_COMMUNITY): Payer: Self-pay | Admitting: *Deleted

## 2017-01-12 DIAGNOSIS — R45851 Suicidal ideations: Secondary | ICD-10-CM | POA: Diagnosis not present

## 2017-01-12 DIAGNOSIS — Z9119 Patient's noncompliance with other medical treatment and regimen: Secondary | ICD-10-CM

## 2017-01-12 DIAGNOSIS — Z818 Family history of other mental and behavioral disorders: Secondary | ICD-10-CM | POA: Diagnosis not present

## 2017-01-12 DIAGNOSIS — Z915 Personal history of self-harm: Secondary | ICD-10-CM | POA: Diagnosis not present

## 2017-01-12 DIAGNOSIS — E119 Type 2 diabetes mellitus without complications: Secondary | ICD-10-CM | POA: Diagnosis present

## 2017-01-12 DIAGNOSIS — F332 Major depressive disorder, recurrent severe without psychotic features: Secondary | ICD-10-CM | POA: Diagnosis not present

## 2017-01-12 DIAGNOSIS — E079 Disorder of thyroid, unspecified: Secondary | ICD-10-CM | POA: Diagnosis not present

## 2017-01-12 DIAGNOSIS — I10 Essential (primary) hypertension: Secondary | ICD-10-CM | POA: Diagnosis present

## 2017-01-12 DIAGNOSIS — E118 Type 2 diabetes mellitus with unspecified complications: Secondary | ICD-10-CM | POA: Diagnosis not present

## 2017-01-12 DIAGNOSIS — J449 Chronic obstructive pulmonary disease, unspecified: Secondary | ICD-10-CM | POA: Diagnosis not present

## 2017-01-12 LAB — GLUCOSE, CAPILLARY
GLUCOSE-CAPILLARY: 105 mg/dL — AB (ref 65–99)
Glucose-Capillary: 83 mg/dL (ref 65–99)

## 2017-01-12 MED ORDER — ATORVASTATIN CALCIUM 40 MG PO TABS
40.0000 mg | ORAL_TABLET | Freq: Every day | ORAL | Status: DC
  Administered 2017-01-12 – 2017-01-16 (×5): 40 mg via ORAL
  Filled 2017-01-12 (×7): qty 1

## 2017-01-12 MED ORDER — VITAMIN D3 25 MCG (1000 UNIT) PO TABS
1000.0000 [IU] | ORAL_TABLET | Freq: Every day | ORAL | Status: DC
  Administered 2017-01-12 – 2017-01-17 (×6): 1000 [IU] via ORAL
  Filled 2017-01-12 (×9): qty 1

## 2017-01-12 MED ORDER — GLUCERNA SHAKE PO LIQD: 237.0000 mL | Freq: Three times a day (TID) | ORAL | Status: DC

## 2017-01-12 MED ORDER — CALCIUM CARBONATE 1250 (500 CA) MG PO TABS
1250.0000 mg | ORAL_TABLET | Freq: Two times a day (BID) | ORAL | Status: DC
  Administered 2017-01-12 – 2017-01-17 (×11): 1250 mg via ORAL
  Filled 2017-01-12 (×18): qty 1

## 2017-01-12 MED ORDER — DULOXETINE HCL 20 MG PO CPEP
20.0000 mg | ORAL_CAPSULE | Freq: Two times a day (BID) | ORAL | Status: AC
  Administered 2017-01-12 – 2017-01-14 (×6): 20 mg via ORAL
  Filled 2017-01-12 (×9): qty 1

## 2017-01-12 MED ORDER — LOSARTAN POTASSIUM 50 MG PO TABS
100.0000 mg | ORAL_TABLET | Freq: Every day | ORAL | Status: DC
  Administered 2017-01-12 – 2017-01-17 (×6): 100 mg via ORAL
  Filled 2017-01-12 (×8): qty 2

## 2017-01-12 MED ORDER — TRAMADOL HCL 50 MG PO TABS: 50.0000 mg | ORAL_TABLET | Freq: Four times a day (QID) | ORAL | Status: DC | PRN

## 2017-01-12 MED ORDER — MIRTAZAPINE 7.5 MG PO TABS
7.5000 mg | ORAL_TABLET | Freq: Every day | ORAL | Status: DC
  Administered 2017-01-12: 7.5 mg via ORAL
  Filled 2017-01-12 (×3): qty 1

## 2017-01-12 MED ORDER — METFORMIN HCL ER 500 MG PO TB24
500.0000 mg | ORAL_TABLET | Freq: Every day | ORAL | Status: DC
  Administered 2017-01-12 – 2017-01-17 (×6): 500 mg via ORAL
  Filled 2017-01-12 (×9): qty 1

## 2017-01-12 MED ORDER — ALUM & MAG HYDROXIDE-SIMETH 200-200-20 MG/5ML PO SUSP: 30.0000 mL | ORAL | Status: DC | PRN

## 2017-01-12 MED ORDER — LEVOTHYROXINE SODIUM 88 MCG PO TABS
88.0000 ug | ORAL_TABLET | Freq: Every day | ORAL | Status: DC
  Administered 2017-01-12 – 2017-01-17 (×6): 88 ug via ORAL
  Filled 2017-01-12 (×9): qty 1

## 2017-01-12 MED ORDER — ENSURE ENLIVE PO LIQD: 237.0000 mL | Freq: Three times a day (TID) | ORAL | Status: DC

## 2017-01-12 MED ORDER — ACETAMINOPHEN 325 MG PO TABS: 650.0000 mg | ORAL_TABLET | Freq: Four times a day (QID) | ORAL | Status: DC | PRN

## 2017-01-12 MED ORDER — METOPROLOL SUCCINATE ER 25 MG PO TB24
25.0000 mg | ORAL_TABLET | Freq: Every day | ORAL | Status: DC
  Administered 2017-01-12 – 2017-01-17 (×6): 25 mg via ORAL
  Filled 2017-01-12 (×8): qty 1

## 2017-01-12 MED ORDER — MAGNESIUM HYDROXIDE 400 MG/5ML PO SUSP
30.0000 mL | Freq: Every day | ORAL | Status: DC | PRN
  Administered 2017-01-12: 30 mL via ORAL
  Filled 2017-01-12: qty 30

## 2017-01-12 MED ORDER — SACCHAROMYCES BOULARDII 250 MG PO CAPS
250.0000 mg | ORAL_CAPSULE | Freq: Two times a day (BID) | ORAL | Status: DC
  Administered 2017-01-12 – 2017-01-17 (×11): 250 mg via ORAL
  Filled 2017-01-12 (×15): qty 1

## 2017-01-12 MED ORDER — ADULT MULTIVITAMIN W/MINERALS CH
1.0000 | ORAL_TABLET | Freq: Every day | ORAL | Status: DC
  Administered 2017-01-12 – 2017-01-17 (×6): 1 via ORAL
  Filled 2017-01-12 (×8): qty 1

## 2017-01-12 NOTE — [facility] Suicide Risk Assessment (Signed)
[facility] INPATIENT:  Family/Significant Other Suicide Prevention Education  Suicide Prevention Education:  Education Completed; Arelia Sneddon (sister [phone]), has been identified by the patient as the family member/significant other with whom the patient will be residing, and identified as the person(s) who will aid the patient in the event of a mental health crisis (suicidal ideations/suicide attempt).  With written consent from the patient, the family member/significant other has been provided the following suicide prevention education, prior to the and/or following the discharge of the patient.  The suicide prevention education provided includes the following:  Suicide risk factors  Suicide prevention and interventions  National Suicide Hotline telephone number  [facility] assessment telephone number  [facility] Emergency Assistance [place] and/or Residential Mobile Crisis Unit telephone number  Request made of family/significant other to:  Remove weapons (e.g., guns, rifles, knives), all items previously/currently identified as safety concern.    Remove drugs/medications (over-the-counter, prescriptions, illicit drugs), all items previously/currently identified as a safety concern.  The family member/significant other verbalizes understanding of the suicide prevention education information provided.  The family member/significant other agrees to remove the items of safety concern listed above.  Pt's sister stated that she became concerned about the pt when she stopped eating and lost a significant amount of weight. Pt's sister stated that pt began to refuse to go to her doctor's appointments and to take her medications. This is when pt's sister decided to bring pt to the hospital. Pt's sister is concerned about pt's increasing depression.  Georga Kaufmann, MSW, LCSWA  01/12/2017, 11:19 AM

## 2017-01-12 NOTE — Progress Notes (Signed)
Recreation Therapy Notes  INPATIENT RECREATION THERAPY ASSESSMENT  Patient Details Name: LIDA BERKERY MRN: [id number] DOB: 11/18/1955 Today's Date: 01/12/2017  Patient Stressors: Other (Comment) (Worry about a lot of stuff)  Pt stated she was here because her sister brought and that she had been refusing to eat or drink.  Coping Skills:   Isolate, Avoidance, Self-Injury, Exercise, Talking, Music, Other (Comment) (Read, write)  Personal Challenges: Anger, Communication, Concentration, Decision-Making, Expressing Yourself, Problem-Solving, Self-Esteem/Confidence, Social Interaction, Stress Management, Time Management, Trusting Others  Leisure Interests (2+):  Exercise - Walking, Individual - Reading, Individual - TV  Awareness of Community Resources:  Yes  Community Resources:  Park  Current Use: No  If no, Barriers?: Other (Comment) (Weather)  Patient Strengths:  Normally a people person; try be organized  Patient Identified Areas of Improvement:  Communication; Get out a little more  Current Recreation Participation:  Not that much  Patient Goal for Hospitalization:  "Be a better person and do things right"  [place] of Residence:  [place] of Residence:  [place]  Current [place] (including self-harm):  No  Current HI:  No  Consent to Intern Participation: N/A   Victorino Sparrow, LRT/CTRS  Victorino Sparrow A 01/12/2017, 2:28 PM

## 2017-01-12 NOTE — Progress Notes (Signed)
Nursing Progress Note: 7p-7a D: Pt currently presents with a pleasant affect and behavior. Pt states "I feel better today." Interacting appropriately with milieu. Pt reports good sleep with current medication regimen.   A: Pt provided with medications per providers orders. Pt's labs and vitals were monitored throughout the night. Pt supported emotionally and encouraged to express concerns and questions. Pt educated on medications.  R: Pt's safety ensured with 15 minute and environmental checks. Pt currently denies SI/HI/Self Harm and AVH. Pt verbally contracts to seek staff if SI/HI or A/VH occurs and to consult with staff before acting on any harmful thoughts. Will continue to monitor.

## 2017-01-12 NOTE — Progress Notes (Signed)
NUTRITION ASSESSMENT  Pt identified as at risk on the Malnutrition Screen Tool  INTERVENTION: 1. Supplements: Ensure Enlive po TID, each supplement provides 350 kcal and 20 grams of protein  NUTRITION DIAGNOSIS: Unintentional weight loss related to sub-optimal intake as evidenced by pt report.   Goal: Pt to meet >/= 90% of their estimated nutrition needs.  Monitor:  PO intake  Assessment:  Pt admitted for depression. Per chart, pt told sister she was not longer going to eat or drink as a SA. Pt refused food and drink when offered by RN. Pt was seen by RD at [facility] on 4/9, was diagnosed with severe malnutrition at that time. Pt reported poor appetite.  Per chart review, pt has lost 20 lb since 4/10 (15% wt loss x 2 weeks, significant for time frame). Pt's malnutrition status is still an issue. Will order Ensure supplements TID.   Height: Ht Readings from Last 1 Encounters:  01/12/17 5\' 5"  (1.651 m)    Weight: Wt Readings from Last 1 Encounters:  01/12/17 114 lb (51.7 kg)    Weight Hx: Wt Readings from Last 10 Encounters:  01/12/17 114 lb (51.7 kg)  01/11/17 134 lb (60.8 kg)  12/28/16 134 lb (60.8 kg)  03/10/16 165 lb (74.8 kg)  03/06/15 163 lb (73.9 kg)  11/23/14 175 lb (79.4 kg)  02/07/14 180 lb (81.6 kg)  05/20/13 178 lb (80.7 kg)  02/01/13 179 lb (81.2 kg)  10/15/12 178 lb 12.8 oz (81.1 kg)    BMI:  Body mass index is 18.97 kg/m. Pt meets criteria for normal based on current BMI.  Estimated Nutritional Needs: Kcal: 25-30 kcal/kg Protein: > 1 gram protein/kg Fluid: 1 ml/kcal  Diet Order: Diet regular Room service appropriate? Yes; Fluid consistency: Thin Pt is also offered choice of unit snacks mid-morning and mid-afternoon.  Pt is eating as desired.   Lab results and medications reviewed.   Clayton Bibles, MS, RD, LDN Pager: [phone] After Hours Pager: [phone]

## 2017-01-12 NOTE — BHH Suicide Risk Assessment (Signed)
[facility] Admission Suicide Risk Assessment   Nursing information obtained from:  Patient Demographic factors:  Divorced or widowed, Caucasian, Low socioeconomic status Current Mental Status:  Self-harm thoughts Loss Factors:  Decline in physical health, Financial problems / change in socioeconomic status Historical Factors:  Prior suicide attempts, Anniversary of important loss Risk Reduction Factors:  Sense of responsibility to family, Religious beliefs about death, Living with another person, especially a relative  Total Time spent with patient: 30 minutes Principal Problem: MDD (major depressive disorder), recurrent severe, without psychosis ([place]) Diagnosis:   Patient Active Problem List   Diagnosis Date Noted  . MDD (major depressive disorder), recurrent severe, without psychosis ([place]) [F33.2] 01/12/2017  . Sepsis ([place]) [A41.9] 12/27/2016  . Dysthymic disorder [F34.1] 12/27/2016  . Protein-calorie malnutrition, severe [E43] 12/27/2016  . Hypertension [I10]   . Thyroid disease [E07.9]   . SVT (supraventricular tachycardia) ([place]) [I47.1]   . Depression with suicidal ideation [F32.9, R45.851] 12/13/2016  . Female cystocele [N81.10] 02/07/2014  . History of vitamin D deficiency [Z86.39] 01/27/2012  . Hypercholesterolemia [E78.00]   . Back pain, chronic [M54.9, G89.29]    Subjective Data: Please see H&P.   Continued Clinical Symptoms:  Alcohol Use Disorder Identification Test Final Score (AUDIT): 0 The "Alcohol Use Disorders Identification Test", Guidelines for Use in Primary Care, Second Edition.  World Pharmacologist [facility]). Score between 0-7:  no or low risk or alcohol related problems. Score between 8-15:  moderate risk of alcohol related problems. Score between 16-19:  high risk of alcohol related problems. Score 20 or above:  warrants further diagnostic evaluation for alcohol dependence and treatment.   CLINICAL FACTORS:   Previous Psychiatric Diagnoses and  Treatments Medical Diagnoses and Treatments/Surgeries   Musculoskeletal: Strength & Muscle Tone: within normal limits Gait & Station: normal Patient leans: N/A  Psychiatric Specialty Exam: Physical Exam  ROS  Blood pressure 123/61, pulse 68, temperature 97.8 F (36.6 C), temperature source Oral, resp. rate 20, height 5\' 5"  (1.651 m), weight 51.7 kg (114 lb).Body mass index is 18.97 kg/m.  Please see H&P.                                                         COGNITIVE FEATURES THAT CONTRIBUTE TO RISK:  Closed-mindedness, Polarized thinking and Thought constriction (tunnel vision)    SUICIDE RISK:   Moderate:  Frequent suicidal ideation with limited intensity, and duration, some specificity in terms of plans, no associated intent, good self-control, limited dysphoria/symptomatology, some risk factors present, and identifiable protective factors, including available and accessible social support.  PLAN OF CARE: Please see H&P.   I certify that inpatient services furnished can reasonably be expected to improve the patient's condition.   Karin Griffith, MD 01/12/2017, 12:14 PM

## 2017-01-12 NOTE — Tx Team (Signed)
Initial Treatment Plan 01/12/2017 4:17 AM Crist Fat BJY:[id number]    PATIENT STRESSORS: Financial difficulties Health problems Loss of husband, childern per history   PATIENT STRENGTHS: Average or above average intelligence Communication skills General fund of knowledge Supportive family/friends   PATIENT IDENTIFIED PROBLEMS:  "my sister put me in here cause I told her I wasn't going to eat or drink anymore"  "felt like I screwed everything up so I tried to help myself die"  "lost weight, stomach problems"  "not sleeping"   "depressed"   "I'm a worrier, I worry about everything"             DISCHARGE CRITERIA:  Improved stabilization in mood, thinking, and/or behavior Medical problems require only outpatient monitoring Need for constant or close observation no longer present Reduction of life-threatening or endangering symptoms to within safe limits Verbal commitment to aftercare and medication compliance  PRELIMINARY DISCHARGE PLAN: Attend aftercare/continuing care group Outpatient therapy Participate in family therapy Return to previous living arrangement  PATIENT/FAMILY INVOLVEMENT: This treatment plan has been presented to and reviewed with the patient, Carrie Macias, and/or family member.  The patient and family have been given the opportunity to ask questions and make suggestions.  Zoe Lan, RN 01/12/2017, 4:17 AM

## 2017-01-12 NOTE — Progress Notes (Signed)
Recreation Therapy Notes  Date: 01/12/17 Time: 1000 Location: 300 Hall Dayroom  Group Topic: Self-Esteem  Goal Area(s) Addresses:  Patient will identify positive ways to increase self-esteem. Patient will verbalize benefit of increased self-esteem.  Intervention: Blank picture frame worksheet, colored pencils, markers  Activity: What Makes Me, Me.  Patients were given a blank sheet of paper outlined with a picture frame.  Patients were to highlight their positive traits through either a poem, rap or picture.  Education:  Self-Esteem, Dentist.   Education Outcome: Acknowledges education/In group clarification offered/Needs additional education  Clinical Observations/Feedback: Pt did not attend group.    Victorino Sparrow, LRT/CTRS         Victorino Sparrow A 01/12/2017 12:23 PM

## 2017-01-12 NOTE — BHH Group Notes (Signed)
[facility] Group Notes:  (Counselor/Nursing/MHT/Case Management/Adjunct)  01/12/2017 1:15PM  Type of Therapy:  Group Therapy  Participation Level:  Active  Participation Quality:  Appropriate  Affect:  Flat  Cognitive:  Oriented  Insight:  Improving  Engagement in Group:  Limited  Engagement in Therapy:  Limited  Modes of Intervention:  Discussion, Exploration and Socialization  Summary of Progress/Problems: The topic for group was balance in life.  Pt participated in the discussion about when their life was in balance and out of balance and how this feels.  Pt discussed ways to get back in balance and short term goals they can work on to get where they want to be. Stayed the entire time, engaged throughout.  "I can't say I am balanced, but I am sure better than I was yesterday.  I have hope."  Talked about how her faith is important to her-relationship with Jehovah-and how she reads the Bible and prays to find balance.  Feels that is the source of her hope.   Trish Mage 01/12/2017 3:39 PM

## 2017-01-12 NOTE — Progress Notes (Signed)
Patient ID: Carrie Macias, female   DOB: 06-25-1956, 61 y.o.   MRN: [id number] Client is a 61 yo female admitted involuntarily with SI. "My sister put me in here cause I told her I wasn't going to eat or drink anymore" "lost weight, had stomach problems, GERD. " "didn't want to eat and stuff, felt like I screwed everything up so I tried to help myself die" Client report many loses in her life. When asked about her family client reports "they all dead" "lost my 85 yo son in a car accident in '89, my  husband in 2012, my 47 yo daughter a year after that from flu and pneumonia." Client lives with her sister, she has another sister and brother. Client reports symptoms of depression "not sleeping, eating, I worry about everything" Client recent admission was at [facility] with SA, cut to wrist. Client has verbally contracted for safety. Client has medical history of thyroid cancer, GERDS, HTN, hyperlipidemia, diabetes, generalized weakness, and HOH(bilateral hearing aids). Client is a high fall risk. Admission assessment completed, client oriented to unit, offered food/drink which she refused. Staff will monitor q35min for safety. Client remains safe on the unit.

## 2017-01-12 NOTE — ED Notes (Signed)
Pt awaiting transport to [facility].

## 2017-01-12 NOTE — H&P (Signed)
Psychiatric Admission Assessment Adult  Patient Identification: Carrie Macias MRN:  [id number] Date of Evaluation:  01/12/2017 Chief Complaint:Patient states " I am depressed , but I am trying to cope."   Principal Diagnosis: MDD (major depressive disorder), recurrent severe, without psychosis ([place]) Diagnosis:   Patient Active Problem List   Diagnosis Date Noted  . MDD (major depressive disorder), recurrent severe, without psychosis ([place]) [F33.2] 01/12/2017  . Sepsis ([place]) [A41.9] 12/27/2016  . Dysthymic disorder [F34.1] 12/27/2016  . Protein-calorie malnutrition, severe [E43] 12/27/2016  . Hypertension [I10]   . Thyroid disease [E07.9]   . SVT (supraventricular tachycardia) ([place]) [I47.1]   . Depression with suicidal ideation [F32.9, R45.851] 12/13/2016  . Female cystocele [N81.10] 02/07/2014  . History of vitamin D deficiency [Z86.39] 01/27/2012  . Hypercholesterolemia [E78.00]   . Back pain, chronic [M54.9, G89.29]    History of Present Illness: Carrie Macias is a 61 y old CF, who is widowed , unemployed , lives with her sister in [place] , was IVCed by sister and brought to [facility] initially by [facility]PD for worsening depression , S/P suicide attempts x2 by OD on pills as well as by attempting to cut her wrist.  Patient seen and chart reviewed.Discussed patient with treatment team. Pt today seen as calm , cooperative . She reports feeling depressed and anxious , but she has been trying to cope . She is motivated to get help and make a change in her life. She reports that her husband of 18 years passed away from a cardiac arrest in 12/19/09 , her son who was 62 y old died in a car wreck in 12-20-87 , her daughter who was 62 yrs old passed away from the flu in Dec 20, 2010. She has sisters who are supportive and lives with one of them. She reports that she also went through a thyroid cancer , also had recent medical admission for sepsis . She reports that she felt more and more depressed , had anhedonia and loss of appetite  and poor sleep and she felt like life has no meaning. She reports that she tried to kill self by OD sing on [place] 2 months ago and also recently tried to cut her wrist with a knife. She reports that she had stopped taking all her medications , but however she feels like it is is a mistake and has started taking them again.  She reports that she was on lexapro and remeron, however was noncompliant.  She was supposed to go to her first appointment at [facility] today , but was unable to do so since she got admitted here.  She reports that she does have some anxiety sx, worries a lot , but nothing extreme.  She denies hx of abuse, denies mania/hypomanic sx.   Associated Signs/Symptoms: Depression Symptoms:  depressed mood, anhedonia, insomnia, fatigue, feelings of worthlessness/guilt, difficulty concentrating, suicidal thoughts with specific plan, suicidal attempt, anxiety, (Hypo) Manic Symptoms:  Impulsivity, Anxiety Symptoms:  Excessive Worry, Psychotic Symptoms:  denies PTSD Symptoms: Negative Total Time spent with patient: 45 minutes  Past Psychiatric History:Patient was seen by our consult team during her recent admission to medical floor , she was started on lexapro and remeron , but was noncompliant. She is scheduled to follow up with a psychiatrist on an outpatient basis. She has had several suicide attempts , OD on ambien as well as has superficial lacerations on her wrist where she tried to cut with a knife.  Is the patient at risk to self?  Yes.    Has the patient been a risk to self in the past 6 months? Yes.    Has the patient been a risk to self within the distant past? No.  Is the patient a risk to others? No.  Has the patient been a risk to others in the past 6 months? No.  Has the patient been a risk to others within the distant past? No.   Prior Inpatient Therapy:   Prior Outpatient Therapy:    Alcohol Screening: Patient refused Alcohol Screening  Tool: Yes 1. How often do you have a drink containing alcohol?: Never 9. Have you or someone else been injured as a result of your drinking?: No 10. Has a relative or friend or a doctor or another health worker been concerned about your drinking or suggested you cut down?: No Alcohol Use Disorder Identification Test Final Score (AUDIT): 0 Brief Intervention: Patient declined brief intervention Substance Abuse History in the last 12 months:  No. Consequences of Substance Abuse: Negative Previous Psychotropic Medications: Yes , lexapro, remeron Psychological Evaluations: Yes  Past Medical History:  Past Medical History:  Diagnosis Date  . Allergy   . Anxiety   . Asthma   . Back pain, chronic   . Cancer (HCC)    THYROID  . Depression   . Detrusor dyssynergia   . Diabetes mellitus    TYPE II  . GERD (gastroesophageal reflux disease)   . Heart murmur   . Hormone disorder    HYPOTHYROIDISM  . Hypercholesterolemia   . Hypertension   . Thyroid disease     Past Surgical History:  Procedure Laterality Date  . ABDOMINAL HYSTERECTOMY     LAVH/RSO/SLING/ANT/COLPO  . COLONOSCOPY  07/16/2010  . EXTERNAL EAR SURGERY    . INNER EAR SURGERY     IMPLANT  . TONSILLECTOMY AND ADENOIDECTOMY    . TUBAL LIGATION     Family History:  Family History  Problem Relation Age of Onset  . Diabetes Mother   . Hypertension Mother   . Heart disease Mother   . Fibromyalgia Mother   . Congestive Heart Failure Mother   . Hypertension Father   . Congestive Heart Failure Father   . Hypertension Sister   . COPD Sister   . OCD Sister   . Fibromyalgia Sister   . Bipolar disorder Sister   . Bipolar disorder Brother   . Bipolar disorder Sister   . Bipolar disorder Maternal Grandfather    Family Psychiatric  History: please see above Tobacco Screening: Have you used any form of tobacco in the last 30 days? (Cigarettes, Smokeless Tobacco, Cigars, and/or Pipes): No Social History: patient is widowed  , lives with her sister in [place] , lives on the benefits she receives on behalf of her husband , currently unemployed. History  Alcohol Use  . 0.0 oz/week    Comment: SELDOM     History  Drug Use No    Additional Social History: Marital status: Widowed Widowed, when?: Pt's husband died 5 years ago  Does patient have children?: Yes How many children?: 2 (1 daughter, 1 son) How is patient's relationship with their children?: Son died in 52 in a car crash, daughter died in 2012-01-02 from the flu                          Allergies:   Allergies  Allergen Reactions  . Aspirin Nausea Only    Sick to stomach  .  Ibuprofen Nausea Only    Sick to stomach   Lab Results:  Results for orders placed or performed during the hospital encounter of 01/12/17 (from the past 48 hour(s))  Glucose, capillary     Status: Abnormal   Collection Time: 01/12/17  6:06 AM  Result Value Ref Range   Glucose-Capillary 105 (H) 65 - 99 mg/dL    Blood Alcohol level:  Lab Results  Component Value Date   ETH <5 01/11/2017   ETH <5 81/19/1478    Metabolic Disorder Labs:  Lab Results  Component Value Date   HGBA1C 6.1 (H) 12/27/2016   MPG 128 12/27/2016   No results found for: PROLACTIN No results found for: CHOL, TRIG, HDL, CHOLHDL, VLDL, LDLCALC  Current Medications: Current Facility-Administered Medications  Medication Dose Route Frequency Provider Last Rate Last Dose  . acetaminophen (TYLENOL) tablet 650 mg  650 mg Oral Q6H PRN Laverle Hobby, PA-C      . alum & mag hydroxide-simeth (MAALOX/MYLANTA) 200-200-20 MG/5ML suspension 30 mL  30 mL Oral Q4H PRN Laverle Hobby, PA-C      . atorvastatin (LIPITOR) tablet 40 mg  40 mg Oral q1800 Laverle Hobby, PA-C      . calcium carbonate (OS-CAL - dosed in mg of elemental calcium) tablet 1,250 mg  1,250 mg Oral BID WC Laverle Hobby, PA-C   1,250 mg at 01/12/17 0819  . cholecalciferol (VITAMIN D) tablet 1,000 Units  1,000 Units Oral Daily Laverle Hobby, PA-C   1,000 Units at 01/12/17 0820  . DULoxetine (CYMBALTA) DR capsule 20 mg  20 mg Oral BID Laverle Hobby, PA-C   20 mg at 01/12/17 2956  . feeding supplement (GLUCERNA SHAKE) (GLUCERNA SHAKE) liquid 237 mL  237 mL Oral TID BM Kerrie Buffalo, NP   Stopped at 01/12/17 1120  . levothyroxine (SYNTHROID, LEVOTHROID) tablet 88 mcg  88 mcg Oral QAC breakfast Laverle Hobby, PA-C   88 mcg at 01/12/17 1111  . losartan (COZAAR) tablet 100 mg  100 mg Oral Daily Laverle Hobby, PA-C   100 mg at 01/12/17 2130  . magnesium hydroxide (MILK OF MAGNESIA) suspension 30 mL  30 mL Oral Daily PRN Laverle Hobby, PA-C      . metFORMIN (GLUCOPHAGE-XR) 24 hr tablet 500 mg  500 mg Oral Q breakfast Laverle Hobby, PA-C   500 mg at 01/12/17 0820  . metoprolol succinate (TOPROL-XL) 24 hr tablet 25 mg  25 mg Oral Daily Laverle Hobby, PA-C   25 mg at 01/12/17 8657  . mirtazapine (REMERON) tablet 7.5 mg  7.5 mg Oral QHS Laverle Hobby, PA-C      . multivitamin with minerals tablet 1 tablet  1 tablet Oral Daily Laverle Hobby, PA-C   1 tablet at 01/12/17 0818  . saccharomyces boulardii (FLORASTOR) capsule 250 mg  250 mg Oral BID Laverle Hobby, PA-C   250 mg at 01/12/17 0818  . traMADol (ULTRAM) tablet 50 mg  50 mg Oral Q6H PRN Laverle Hobby, PA-C       PTA Medications: Prescriptions Prior to Admission  Medication Sig Dispense Refill Last Dose  . Ascorbic Acid (VITAMIN C PO) Take 1 tablet by mouth daily.   01/10/2017 at Unknown time  . calcium carbonate (OS-CAL) 600 MG TABS Take 600 mg by mouth 2 (two) times daily with a meal.   01/10/2017 at Unknown time  . cholecalciferol (VITAMIN D) 1000 UNITS tablet Take 1,000 Units  by mouth daily.   01/10/2017 at Unknown time  . escitalopram (LEXAPRO) 5 MG tablet Take 1 tablet (5 mg total) by mouth daily. 30 tablet 0 Past Week at Unknown time  . levothyroxine (SYNTHROID, LEVOTHROID) 88 MCG tablet Take 88 mcg by mouth daily.    01/10/2017 at Unknown time  . losartan  (COZAAR) 100 MG tablet Take 100 mg by mouth daily.    01/10/2017 at Unknown time  . metFORMIN (GLUCOPHAGE-XR) 500 MG 24 hr tablet Take 500 mg by mouth daily.   3 01/10/2017 at Unknown time  . metoprolol succinate (TOPROL-XL) 25 MG 24 hr tablet Take 1 tablet (25 mg total) by mouth daily. 30 tablet 0 01/10/2017 at Unknown time  . mirtazapine (REMERON) 7.5 MG tablet Take 1 tablet (7.5 mg total) by mouth at bedtime. 30 tablet 0 01/10/2017 at Unknown time  . Multiple Vitamin (MULTIVITAMIN) tablet Take 1 tablet by mouth daily.   01/10/2017 at Unknown time  . saccharomyces boulardii (FLORASTOR) 250 MG capsule Take 1 capsule (250 mg total) by mouth 2 (two) times daily. 20 capsule 0 Past Week at Unknown time  . simvastatin (ZOCOR) 80 MG tablet Take 80 mg by mouth at bedtime.   01/10/2017 at Unknown time  . traMADol (ULTRAM) 50 MG tablet Take 1 tablet (50 mg total) by mouth every 6 (six) hours as needed (for pain). 20 tablet 0 unknown    Musculoskeletal: Strength & Muscle Tone: within normal limits Gait & Station: normal Patient leans: N/A  Psychiatric Specialty Exam: Physical Exam  Review of Systems  Psychiatric/Behavioral: Positive for depression. The patient is nervous/anxious and has insomnia.   All other systems reviewed and are negative.   Blood pressure 123/61, pulse 68, temperature 97.8 F (36.6 C), temperature source Oral, resp. rate 20, height 5\' 5"  (1.651 m), weight 51.7 kg (114 lb).Body mass index is 18.97 kg/m.  General Appearance: Casual  Eye Contact:  Fair  Speech:  Normal Rate  Volume:  Normal  Mood:  Anxious and Depressed  Affect:  Depressed  Thought Process:  Goal Directed and Descriptions of Associations: Intact  Orientation:  Full (Time, Place, and Person)  Thought Content:  Rumination  Suicidal Thoughts:  currently denies , but is s/p atleast 2 recent suicide attempts , also had stopped all her medications as well as meals since she felt hopeless  Homicidal Thoughts:  No   Memory:  Immediate;   Fair Recent;   Fair Remote;   Fair  Judgement:  Impaired  Insight:  Fair  Psychomotor Activity:  Decreased  Concentration:  Concentration: Fair and Attention Span: Fair  Recall:  [employer] of Knowledge:  Fair  Language:  Fair  Akathisia:  No  Handed:  Right  AIMS (if indicated):     Assets:  Communication Skills Desire for Improvement  ADL's:  Intact  Cognition:  WNL  Sleep:  Number of Hours: 2    Treatment Plan Summary:Patient with hx of depression , has had several psychosocial stressors, deaths of her husband , son and daughter , also has a hx of thyroid cancer, recent medical issues requiring hospitalization. Pt was feeling more and more depressed and hopeless, recent suicidal gestures , she was also noncompliant on medications. She will benefit from IP admission and treatment.  Daily contact with patient to assess and evaluate symptoms and progress in treatment, Medication management and Plan see below Patient will benefit from inpatient treatment and stabilization.  Estimated length of stay is 5-7 days.  Reviewed past medical records,treatment plan. Will continue Cymbalta 20 mg po bid ( since already initiated on admission) for affective sx. Will continue Remeron 7.5 mg po qhs for insomnia. Will continue Metformin 500 mg po bid for DM. CBG s as per unit protocol. Cozaar 100 mg po daily for cardiac issues. Metoprolol xl 25 mg po daily for for HTN. Lipitor 40 mg po daily for HLD. Synthroid 88 mcg daily for hypothyroidism. Patient will benefit from CBT/grief counseling. Will continue to monitor vitals ,medication compliance and treatment side effects while patient is here.  Will monitor for medical issues as well as call consult as needed.  Reviewed labs ,cmp - wnl, cbc- wnl, uds- wnl, will get tsh. CSW will start working on disposition.  Patient to participate in therapeutic milieu .      Observation Level/Precautions:  15 minute checks     Psychotherapy:  Individual and group therapy     Consultations:  CSW  Discharge Concerns:  Stability and safety       Physician Treatment Plan for Primary Diagnosis: MDD (major depressive disorder), recurrent severe, without psychosis ([place]) Long Term Goal(s): Improvement in symptoms so as ready for discharge  Short Term Goals: Ability to identify changes in lifestyle to reduce recurrence of condition will improve, Ability to verbalize feelings will improve, Ability to disclose and discuss suicidal ideas and Compliance with prescribed medications will improve  Physician Treatment Plan for Secondary Diagnosis: Principal Problem:   MDD (major depressive disorder), recurrent severe, without psychosis ([place])  Long Term Goal(s): Improvement in symptoms so as ready for discharge  Short Term Goals: Ability to identify changes in lifestyle to reduce recurrence of condition will improve, Ability to verbalize feelings will improve, Ability to disclose and discuss suicidal ideas and Compliance with prescribed medications will improve  I certify that inpatient services furnished can reasonably be expected to improve the patient's condition.    Ursula Alert, MD 4/25/201812:43 PM

## 2017-01-12 NOTE — BHH Group Notes (Signed)
[facility] LCSW Aftercare Discharge Planning Group Note   01/12/2017  8:45 AM  Participation Quality: Pt invited. Did not attend.   Georga Kaufmann, MSW, LCSWA 01/12/2017 11:52 AM
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
## 2017-01-12 NOTE — Progress Notes (Signed)
Nursing Note 01/12/2017 [id number]  Data Reports sleeping poor without PRN sleep med.  Rates depression 7/10, hopelessness 0/10, and anxiety 0/10. Affect depressed mood congruent.  Denies HI, SI, AVH.  Patient slept in this AM with RN's OK due to late admission (around 0200).  Had some constipation today which resolved after MOM.  Attending groups, pleasant, appropriate with staff and peers.   Action Spoke with patient 1:1, nurse offered support to patient throughout shift..  Continues to be monitored on 15 minute checks for safety.  Response Remains safe and appropriate on unit.

## 2017-01-12 NOTE — ED Notes (Signed)
[employer] here for pt transport to [facility]

## 2017-01-12 NOTE — Tx Team (Signed)
Interdisciplinary Treatment and Diagnostic Plan Update  01/12/2017 Time of Session: 4:06 PM  Carrie Macias MRN: [id number]  Principal Diagnosis: MDD (major depressive disorder), recurrent severe, without psychosis ([place])  Secondary Diagnoses: Principal Problem:   MDD (major depressive disorder), recurrent severe, without psychosis ([place])   Current Medications:  Current Facility-Administered Medications  Medication Dose Route Frequency Provider Last Rate Last Dose  . acetaminophen (TYLENOL) tablet 650 mg  650 mg Oral Q6H PRN Laverle Hobby, PA-C      . alum & mag hydroxide-simeth (MAALOX/MYLANTA) 200-200-20 MG/5ML suspension 30 mL  30 mL Oral Q4H PRN Laverle Hobby, PA-C      . atorvastatin (LIPITOR) tablet 40 mg  40 mg Oral q1800 Laverle Hobby, PA-C      . calcium carbonate (OS-CAL - dosed in mg of elemental calcium) tablet 1,250 mg  1,250 mg Oral BID WC Laverle Hobby, PA-C   1,250 mg at 01/12/17 0819  . cholecalciferol (VITAMIN D) tablet 1,000 Units  1,000 Units Oral Daily Laverle Hobby, PA-C   1,000 Units at 01/12/17 0820  . DULoxetine (CYMBALTA) DR capsule 20 mg  20 mg Oral BID Laverle Hobby, PA-C   20 mg at 01/12/17 2878  . feeding supplement (GLUCERNA SHAKE) (GLUCERNA SHAKE) liquid 237 mL  237 mL Oral TID BM Kerrie Buffalo, NP   Stopped at 01/12/17 1120  . levothyroxine (SYNTHROID, LEVOTHROID) tablet 88 mcg  88 mcg Oral QAC breakfast Laverle Hobby, PA-C   88 mcg at 01/12/17 1111  . losartan (COZAAR) tablet 100 mg  100 mg Oral Daily Laverle Hobby, PA-C   100 mg at 01/12/17 6767  . magnesium hydroxide (MILK OF MAGNESIA) suspension 30 mL  30 mL Oral Daily PRN Laverle Hobby, PA-C   30 mL at 01/12/17 1307  . metFORMIN (GLUCOPHAGE-XR) 24 hr tablet 500 mg  500 mg Oral Q breakfast Laverle Hobby, PA-C   500 mg at 01/12/17 0820  . metoprolol succinate (TOPROL-XL) 24 hr tablet 25 mg  25 mg Oral Daily Laverle Hobby, PA-C   25 mg at 01/12/17 2094  . mirtazapine (REMERON) tablet  7.5 mg  7.5 mg Oral QHS Laverle Hobby, PA-C      . multivitamin with minerals tablet 1 tablet  1 tablet Oral Daily Laverle Hobby, PA-C   1 tablet at 01/12/17 0818  . saccharomyces boulardii (FLORASTOR) capsule 250 mg  250 mg Oral BID Laverle Hobby, PA-C   250 mg at 01/12/17 0818  . traMADol (ULTRAM) tablet 50 mg  50 mg Oral Q6H PRN Laverle Hobby, PA-C        PTA Medications: Prescriptions Prior to Admission  Medication Sig Dispense Refill Last Dose  . Ascorbic Acid (VITAMIN C PO) Take 1 tablet by mouth daily.   01/10/2017 at Unknown time  . calcium carbonate (OS-CAL) 600 MG TABS Take 600 mg by mouth 2 (two) times daily with a meal.   01/10/2017 at Unknown time  . cholecalciferol (VITAMIN D) 1000 UNITS tablet Take 1,000 Units by mouth daily.   01/10/2017 at Unknown time  . escitalopram (LEXAPRO) 5 MG tablet Take 1 tablet (5 mg total) by mouth daily. 30 tablet 0 Past Week at Unknown time  . levothyroxine (SYNTHROID, LEVOTHROID) 88 MCG tablet Take 88 mcg by mouth daily.    01/10/2017 at Unknown time  . losartan (COZAAR) 100 MG tablet Take 100 mg by mouth daily.    01/10/2017 at Unknown  time  . metFORMIN (GLUCOPHAGE-XR) 500 MG 24 hr tablet Take 500 mg by mouth daily.   3 01/10/2017 at Unknown time  . metoprolol succinate (TOPROL-XL) 25 MG 24 hr tablet Take 1 tablet (25 mg total) by mouth daily. 30 tablet 0 01/10/2017 at Unknown time  . mirtazapine (REMERON) 7.5 MG tablet Take 1 tablet (7.5 mg total) by mouth at bedtime. 30 tablet 0 01/10/2017 at Unknown time  . Multiple Vitamin (MULTIVITAMIN) tablet Take 1 tablet by mouth daily.   01/10/2017 at Unknown time  . saccharomyces boulardii (FLORASTOR) 250 MG capsule Take 1 capsule (250 mg total) by mouth 2 (two) times daily. 20 capsule 0 Past Week at Unknown time  . simvastatin (ZOCOR) 80 MG tablet Take 80 mg by mouth at bedtime.   01/10/2017 at Unknown time  . traMADol (ULTRAM) 50 MG tablet Take 1 tablet (50 mg total) by mouth every 6 (six) hours as needed  (for pain). 20 tablet 0 unknown    Treatment Modalities: Medication Management, Group therapy, Case management,  1 to 1 session with clinician, Psychoeducation, Recreational therapy.   Physician Treatment Plan for Primary Diagnosis: MDD (major depressive disorder), recurrent severe, without psychosis ( [place]) Long Term Goal(s): Improvement in symptoms so as ready for discharge  Short Term Goals: Ability to identify changes in lifestyle to reduce recurrence of condition will improve Ability to verbalize feelings will improve Ability to disclose and discuss suicidal ideas Compliance with prescribed medications will improve Ability to identify changes in lifestyle to reduce recurrence of condition will improve Ability to verbalize feelings will improve Ability to disclose and discuss suicidal ideas Compliance with prescribed medications will improve  Medication Management: Evaluate patient's response, side effects, and tolerance of medication regimen.  Therapeutic Interventions: 1 to 1 sessions, Unit Group sessions and Medication administration.  Evaluation of Outcomes: Progressing  Physician Treatment Plan for Secondary Diagnosis: Principal Problem:   MDD (major depressive disorder), recurrent severe, without psychosis ([place])   Long Term Goal(s): Improvement in symptoms so as ready for discharge  Short Term Goals: Ability to identify changes in lifestyle to reduce recurrence of condition will improve Ability to verbalize feelings will improve Ability to disclose and discuss suicidal ideas Compliance with prescribed medications will improve Ability to identify changes in lifestyle to reduce recurrence of condition will improve Ability to verbalize feelings will improve Ability to disclose and discuss suicidal ideas Compliance with prescribed medications will improve  Medication Management: Evaluate patient's response, side effects, and tolerance of medication regimen.  Therapeutic  Interventions: 1 to 1 sessions, Unit Group sessions and Medication administration.  Evaluation of Outcomes: Progressing   RN Treatment Plan for Primary Diagnosis: MDD (major depressive disorder), recurrent severe, without psychosis ([place]) Long Term Goal(s): Knowledge of disease and therapeutic regimen to maintain health will improve  Short Term Goals: Ability to disclose and discuss suicidal ideas and Ability to identify and develop effective coping behaviors will improve  Medication Management: RN will administer medications as ordered by provider, will assess and evaluate patient's response and provide education to patient for prescribed medication. RN will report any adverse and/or side effects to prescribing provider.  Therapeutic Interventions: 1 on 1 counseling sessions, Psychoeducation, Medication administration, Evaluate responses to treatment, Monitor vital signs and CBGs as ordered, Perform/monitor CIWA, COWS, AIMS and Fall Risk screenings as ordered, Perform wound care treatments as ordered.  Evaluation of Outcomes: Progressing   LCSW Treatment Plan for Primary Diagnosis: MDD (major depressive disorder), recurrent severe, without psychosis ([place]) Long Term Goal(s):  Safe transition to appropriate next level of care at discharge, Engage patient in therapeutic group addressing interpersonal concerns.  Short Term Goals: Engage patient in aftercare planning with referrals and resources  Therapeutic Interventions: Assess for all discharge needs, 1 to 1 time with Social worker, Explore available resources and support systems, Assess for adequacy in community support network, Educate family and significant other(s) on suicide prevention, Complete Psychosocial Assessment, Interpersonal group therapy.  Evaluation of Outcomes: Met  Return home, follow up [facility] outpt   Progress in Treatment: Attending groups: Yes Participating in groups: Yes Taking medication as prescribed: Yes Toleration  medication: Yes, no side effects reported at this time Family/Significant other contact made: Yes Patient understands diagnosis: Yes AEB asking for help with depression Discussing patient identified problems/goals with staff: Yes Medical problems stabilized or resolved: Yes Denies suicidal/homicidal ideation: Yes Issues/concerns per patient self-inventory: None Other: N/A  New problem(s) identified: None identified at this time.   New Short Term/Long Term Goal(s): None identified at this time.   Discharge Plan or Barriers:   Reason for Continuation of Hospitalization:  Depression  Medication stabilization Suicidal ideation   Estimated Length of Stay: 3-5 days  Attendees: Patient: 01/12/2017  4:06 PM  Physician: Leanord Hawking, MD 01/12/2017  4:06 PM  Nursing: Otilio Carpen RN 01/12/2017  4:06 PM  RN Care Manager: Lars Pinks, RN 01/12/2017  4:06 PM  Social Worker: Ripley Fraise 01/12/2017  4:06 PM  Recreational Therapist: Laretta Bolster  01/12/2017  4:06 PM  Other: Norberto Sorenson 01/12/2017  4:06 PM  Other:  01/12/2017  4:06 PM    Scribe for Treatment Team:  Roque Lias LCSW 01/12/2017 4:06 PM

## 2017-01-13 DIAGNOSIS — E118 Type 2 diabetes mellitus with unspecified complications: Secondary | ICD-10-CM

## 2017-01-13 DIAGNOSIS — I10 Essential (primary) hypertension: Secondary | ICD-10-CM

## 2017-01-13 DIAGNOSIS — E079 Disorder of thyroid, unspecified: Secondary | ICD-10-CM

## 2017-01-13 DIAGNOSIS — R45851 Suicidal ideations: Secondary | ICD-10-CM

## 2017-01-13 DIAGNOSIS — J449 Chronic obstructive pulmonary disease, unspecified: Secondary | ICD-10-CM

## 2017-01-13 LAB — GLUCOSE, CAPILLARY
GLUCOSE-CAPILLARY: 118 mg/dL — AB (ref 65–99)
Glucose-Capillary: 87 mg/dL (ref 65–99)

## 2017-01-13 MED ORDER — MIRTAZAPINE 15 MG PO TABS
15.0000 mg | ORAL_TABLET | Freq: Every day | ORAL | Status: DC
  Administered 2017-01-13 – 2017-01-14 (×2): 15 mg via ORAL
  Filled 2017-01-13 (×5): qty 1

## 2017-01-13 MED ORDER — MIRTAZAPINE 15 MG PO TABS: 15.0000 mg | ORAL_TABLET | Freq: Every day | ORAL | Status: DC

## 2017-01-13 NOTE — Progress Notes (Signed)
D    Pt was in her room in bed during our 1:1 conversation   She said her stomach was hurting but did not want to take medications for same    Pt endorses depression and hopelessness   She said she didn't feel like doing anything but laying in bed  A    Verbal support given    Medications administered and effectiveness monitored  Q 15 min checks  Discussed diet with patient and encouraged fluids and meals R   Pt is safe and verbalized understanding of diet details

## 2017-01-13 NOTE — BHH Group Notes (Signed)
[place] LCSW Group Therapy  01/13/2017 1:55 PM  Type of Therapy:  Group Therapy  Participation Level:  Did Not Attend-pt invited. Chose to remain in bed.   Summary of Progress/Problems: [facility] Speaker came to talk about his personal journey with substance abuse and addiction. The pt processed ways by which to relate to the speaker. [place] speaker provided handouts and educational information pertaining to groups and services offered by the [facility].   Sapphira Harjo N Smart LCSW 01/13/2017, 1:55 PM

## 2017-01-13 NOTE — Progress Notes (Signed)
DAR NOTE: Patient presents with anxious affect and depressed mood. Pt stayed in the room the whole day except for medication. Pt did not eat lunch and also refused her Glucerna. Pt denies pain, auditory and visual hallucinations.  Rates depression at 0, hopelessness at 0, and anxiety at 0.  Maintained on routine safety checks.  Medications given as prescribed.  Support and encouragement offered as needed. States goal for today is "relax."  Will continue to monitor.

## 2017-01-13 NOTE — Progress Notes (Addendum)
[facility] MD Progress Note  01/13/2017 11:28 AM Carrie Macias  MRN:  [id number] Subjective:   61 yo Caucasian female, widow, lives with her sister. Involuntarily committed by her sister. Reported to have become very depressed lately. She has given up in life. She stopped taking her medications. She is very withdrawn and not taking care of her self. She has attempted suicide twice recently. She overdosed on ambient and then slit her wrist. Patient gave her dog away. Sister is very concerned as patient is at high risk for suicide. She has had multiple losses over the years. Her son passed in 1989, husband in 2011 and her daughter in 2012. Routine labs are essentially within normal limits.   Chart reviewed today. Patient discussed at team.  Staff reports that she is engaging at groups. She is minimizing symptoms that she presented with. She is tolerating her medications well. No behavioral issues. She has not voiced any suicidal thoughts since she has been here. She does not appear internally stimulated.   Seen today. Says she has been very depressed for the past two months. Says she feels there is no point going on. Says she feels she has ruined her body. Tells me that she has lost over seventy pounds. She has not been eating or drinking as before. Her sister got concerned when she expressed plans to stop completely. She is not sleeping at  Night. Says she is just not able to do things as before. No interest or joy former routines. She is now withdrawn and stays to self. Continues to express hopelessness. Denies any specific plans of suicide in here. No associated hallucinations. No associated paranoia. Concerns that her stomach is unsettled.  Principal Problem: MDD (major depressive disorder), recurrent severe, without psychosis ([place]) Diagnosis:   Patient Active Problem List   Diagnosis Date Noted  . MDD (major depressive disorder), recurrent severe, without psychosis ([place]) [F33.2] 01/12/2017  . Sepsis  ([place]) [A41.9] 12/27/2016  . Dysthymic disorder [F34.1] 12/27/2016  . Protein-calorie malnutrition, severe [E43] 12/27/2016  . Hypertension [I10]   . Thyroid disease [E07.9]   . SVT (supraventricular tachycardia) ([place]) [I47.1]   . Depression with suicidal ideation [F32.9, R45.851] 12/13/2016  . Female cystocele [N81.10] 02/07/2014  . History of vitamin D deficiency [Z86.39] 01/27/2012  . Hypercholesterolemia [E78.00]   . Back pain, chronic [M54.9, G89.29]    Total Time spent with patient: 30 minutes  Past Psychiatric History: As in H&P  Past Medical History:  Past Medical History:  Diagnosis Date  . Allergy   . Anxiety   . Asthma   . Back pain, chronic   . Cancer (HCC)    THYROID  . Depression   . Detrusor dyssynergia   . Diabetes mellitus    TYPE II  . GERD (gastroesophageal reflux disease)   . Heart murmur   . Hormone disorder    HYPOTHYROIDISM  . Hypercholesterolemia   . Hypertension   . Thyroid disease     Past Surgical History:  Procedure Laterality Date  . ABDOMINAL HYSTERECTOMY     LAVH/RSO/SLING/ANT/COLPO  . COLONOSCOPY  07/16/2010  . EXTERNAL EAR SURGERY    . INNER EAR SURGERY     IMPLANT  . TONSILLECTOMY AND ADENOIDECTOMY    . TUBAL LIGATION     Family History:  Family History  Problem Relation Age of Onset  . Diabetes Mother   . Hypertension Mother   . Heart disease Mother   . Fibromyalgia Mother   . Congestive Heart  Failure Mother   . Hypertension Father   . Congestive Heart Failure Father   . Hypertension Sister   . COPD Sister   . OCD Sister   . Fibromyalgia Sister   . Bipolar disorder Sister   . Bipolar disorder Brother   . Bipolar disorder Sister   . Bipolar disorder Maternal Grandfather    Family Psychiatric  History: As in H&P Social History:  History  Alcohol Use  . 0.0 oz/week    Comment: SELDOM     History  Drug Use No    Social History   Social History  . Marital status: Widowed    Spouse name: N/A  . Number of  children: N/A  . Years of education: N/A   Social History Main Topics  . Smoking status: Never Smoker  . Smokeless tobacco: Never Used  . Alcohol use 0.0 oz/week     Comment: SELDOM  . Drug use: No  . Sexual activity: Not Currently    Birth control/ protection: Surgical     Comment: HYST   Other Topics Concern  . None   Social History Narrative  . None   Additional Social History:     Sleep: Poor  Appetite:  Poor  Current Medications: Current Facility-Administered Medications  Medication Dose Route Frequency Provider Last Rate Last Dose  . acetaminophen (TYLENOL) tablet 650 mg  650 mg Oral Q6H PRN Laverle Hobby, PA-C      . alum & mag hydroxide-simeth (MAALOX/MYLANTA) 200-200-20 MG/5ML suspension 30 mL  30 mL Oral Q4H PRN Laverle Hobby, PA-C      . atorvastatin (LIPITOR) tablet 40 mg  40 mg Oral q1800 Laverle Hobby, PA-C   40 mg at 01/12/17 1700  . calcium carbonate (OS-CAL - dosed in mg of elemental calcium) tablet 1,250 mg  1,250 mg Oral BID WC Laverle Hobby, PA-C   1,250 mg at 01/13/17 9381  . cholecalciferol (VITAMIN D) tablet 1,000 Units  1,000 Units Oral Daily Laverle Hobby, PA-C   1,000 Units at 01/13/17 8299  . DULoxetine (CYMBALTA) DR capsule 20 mg  20 mg Oral BID Laverle Hobby, PA-C   20 mg at 01/13/17 3716  . feeding supplement (GLUCERNA SHAKE) (GLUCERNA SHAKE) liquid 237 mL  237 mL Oral TID BM Kerrie Buffalo, NP   Stopped at 01/12/17 1120  . levothyroxine (SYNTHROID, LEVOTHROID) tablet 88 mcg  88 mcg Oral QAC breakfast Laverle Hobby, PA-C   88 mcg at 01/13/17 9678  . losartan (COZAAR) tablet 100 mg  100 mg Oral Daily Laverle Hobby, PA-C   100 mg at 01/13/17 9381  . magnesium hydroxide (MILK OF MAGNESIA) suspension 30 mL  30 mL Oral Daily PRN Laverle Hobby, PA-C   30 mL at 01/12/17 1307  . metFORMIN (GLUCOPHAGE-XR) 24 hr tablet 500 mg  500 mg Oral Q breakfast Laverle Hobby, PA-C   500 mg at 01/13/17 0820  . metoprolol succinate (TOPROL-XL) 24 hr  tablet 25 mg  25 mg Oral Daily Laverle Hobby, PA-C   25 mg at 01/13/17 0175  . mirtazapine (REMERON) tablet 7.5 mg  7.5 mg Oral QHS Laverle Hobby, PA-C   7.5 mg at 01/12/17 2137  . multivitamin with minerals tablet 1 tablet  1 tablet Oral Daily Laverle Hobby, PA-C   1 tablet at 01/13/17 1025  . saccharomyces boulardii (FLORASTOR) capsule 250 mg  250 mg Oral BID Laverle Hobby, PA-C   250 mg  at 01/13/17 9381  . traMADol (ULTRAM) tablet 50 mg  50 mg Oral Q6H PRN Laverle Hobby, PA-C        Lab Results:  Results for orders placed or performed during the hospital encounter of 01/12/17 (from the past 48 hour(s))  Glucose, capillary     Status: Abnormal   Collection Time: 01/12/17  6:06 AM  Result Value Ref Range   Glucose-Capillary 105 (H) 65 - 99 mg/dL  Glucose, capillary     Status: None   Collection Time: 01/12/17  9:34 PM  Result Value Ref Range   Glucose-Capillary 83 65 - 99 mg/dL  Glucose, capillary     Status: None   Collection Time: 01/13/17  5:54 AM  Result Value Ref Range   Glucose-Capillary 87 65 - 99 mg/dL    Blood Alcohol level:  Lab Results  Component Value Date   ETH <5 01/11/2017   ETH <5 82/99/3716    Metabolic Disorder Labs: Lab Results  Component Value Date   HGBA1C 6.1 (H) 12/27/2016   MPG 128 12/27/2016   No results found for: PROLACTIN No results found for: CHOL, TRIG, HDL, CHOLHDL, VLDL, LDLCALC  Physical Findings: AIMS: Facial and Oral Movements Muscles of Facial Expression: None, normal Lips and Perioral Area: None, normal Jaw: None, normal Tongue: None, normal,Extremity Movements Upper (arms, wrists, hands, fingers): None, normal Lower (legs, knees, ankles, toes): None, normal, Trunk Movements Neck, shoulders, hips: None, normal, Overall Severity Severity of abnormal movements (highest score from questions above): None, normal Incapacitation due to abnormal movements: None, normal Patient's awareness of abnormal movements (rate only  patient's report): No Awareness, Dental Status Current problems with teeth and/or dentures?: No Does patient usually wear dentures?: No  CIWA:    COWS:     Musculoskeletal: Strength & Muscle Tone: within normal limits Gait & Station: normal Patient leans: N/A  Psychiatric Specialty Exam: Physical Exam  Constitutional: She is oriented to person, place, and time. She appears well-developed and well-nourished.  HENT:  Head: Normocephalic.  Eyes: Pupils are equal, round, and reactive to light.  Neck: Normal range of motion. Neck supple.  Cardiovascular: Normal rate and regular rhythm.   Respiratory: Effort normal and breath sounds normal.  GI: Soft. Bowel sounds are normal.  Musculoskeletal: Normal range of motion.  Neurological: She is alert and oriented to person, place, and time.  Psychiatric:  As above    ROS  Blood pressure 136/71, pulse 64, temperature 97.7 F (36.5 C), resp. rate 16, height 5\' 5"  (1.651 m), weight 51.7 kg (114 lb).Body mass index is 18.97 kg/m.  General Appearance: In bed, under the sheets while peers are out at the day room. Withdrawn, in bed clothing. Not groomed. Psychomotor retardation. Moderate engagement.   Eye Contact:  Fair  Speech:  Normal Rate  Volume:  Decreased  Mood:  Depressed  Affect:  Blunted and mood congruent.  Thought Process:  Linear  Orientation:  Full (Time, Place, and Person)  Thought Content:  Negative ruminations. Nihilistic ideas about self. Hopelessness and worthlessness. No delusional theme. No preoccupation with violent thoughts. No obsession.  No hallucination in any modality.   Suicidal Thoughts:  Yes.  without intent/plan  Homicidal Thoughts:  No  Memory:  Immediate;   Fair Recent;   Fair Remote;   Fair  Judgement:  Impaired  Insight:  Good  Psychomotor Activity:  Decreased  Concentration:  Concentration: Fair and Attention Span: Fair  Recall:  [employer] of Knowledge:  Good  Language:  Good  Akathisia:  No   Handed:    AIMS (if indicated):     Assets:  Desire for Improvement Housing  ADL's:  Impaired  Cognition:  WNL  Sleep:  Number of Hours: 6.5     Treatment Plan Summary: Patient is severely depressed. She feels hopeless and worthless. She has been restarted on antidepressant medication. She is tolerating it well. We discussed adjustment of Mirtazapine. Risks and benefits were discussed. Patient consented to treatment.    Psychiatric: MDD Recurrent  Medical: Thyroid disease HTN COPD DM  Psychosocial:  Multiple losses Widow   PLAN: 1. Increase Mirtazapine to 15 mg at bedtime 2. Encourage unit groups and activities 3. Continue to monitor mood, behavior and interaction with peers 4. SW would coordinate aftercare.    [place], MD 01/13/2017, 11:28 AM

## 2017-01-13 NOTE — Progress Notes (Signed)
Pt ate 75% of her dinner after a lot encouragement. Pt has been refusing meals and fluids.

## 2017-01-14 LAB — GLUCOSE, CAPILLARY
GLUCOSE-CAPILLARY: 92 mg/dL (ref 65–99)
Glucose-Capillary: 98 mg/dL (ref 65–99)

## 2017-01-14 MED ORDER — DULOXETINE HCL 60 MG PO CPEP
60.0000 mg | ORAL_CAPSULE | Freq: Every day | ORAL | Status: DC
  Administered 2017-01-15 – 2017-01-17 (×3): 60 mg via ORAL
  Filled 2017-01-14 (×5): qty 1

## 2017-01-14 NOTE — Progress Notes (Signed)
  DATA ACTION RESPONSE  Objective- Pt. is visible in the dayroom, seen watching TV quietly. Presents with a depressed/flat affect and mood. Pt. was minimal with interaction. Subjective- Denies having any SI/HI/AVH/Pain at this time. Pt. states "It's been okay".    Continues to be cooperative and remain safe & pleasant  on the unit.  1:1 interaction in private to establish rapport. Encouragement, education, & support given from staff. Meds. ordered and administered. Glucerna offered and refused. Continues to encourage Pt. on maintaining adequate hydration and diet.   Safety maintained with Q 15 checks. Continues to follow treatment plan and will monitor closely. No additonal questions/concerns noted.

## 2017-01-14 NOTE — BHH Group Notes (Signed)
[facility] LCSW Aftercare Discharge Planning Group Note   01/14/2017  8:45 AM  Participation Quality: Pt invited. Did not attend.  Georga Kaufmann, MSW, [facility] 01/14/2017 3:38 PM

## 2017-01-14 NOTE — Plan of Care (Signed)
Problem: Medication: Goal: Compliance with prescribed medication regimen will improve Outcome: Progressing Pt. is taking meds as ordered.

## 2017-01-14 NOTE — Progress Notes (Signed)
Pt attend wrap up group. Her day was a 7. She want to go home today not going due to her medication and she needs to eat more.

## 2017-01-14 NOTE — BHH Group Notes (Signed)
[facility] LCSW Group Therapy  01/14/2017 1:18 PM  Type of Therapy:  Group Therapy  Participation Level:  Active  Participation Quality:  Attentive  Affect:  Depressed and Flat  Cognitive:  Disorganized  Insight:  Lacking  Engagement in Therapy:  Lacking  Modes of Intervention:  Confrontation, Discussion, Education, Problem-solving, Socialization and Support  Summary of Progress/Problems: Feelings Around Diagnosis: Group members were asked to explore feelings about their mental health diagnosis and discuss the positives and negatives their diagnosis. Group members were asked to share how family members/social supports responded to their symptoms and diagnosis and were encouraged to identify how their support network can best assist with their recovery in a helpful and supportive way.   Vernecia Umble N Smart LCSW 01/14/2017, 1:18 PM

## 2017-01-14 NOTE — Plan of Care (Signed)
Problem: Activity: Goal: Interest or engagement in leisure activities will improve Outcome: Progressing Pt sat in the day room for some time.

## 2017-01-14 NOTE — Progress Notes (Signed)
DAR NOTE: Pt present with flat affect and depressed mood in the unit. Pt has been isolating herself and has been bed most of the time. Pt denies physical pain, took all her meds as scheduled. As per self inventory, pt had a fair night sleep, fair appetite, low energy, and poor concentration. Pt rate depression at 3, hopeless ness at 3, and anxiety at 3. Pt's safety ensured with 15 minute and environmental checks. Pt currently denies SI/HI and A/V hallucinations. Pt verbally agrees to seek staff if SI/HI or A/VH occurs and to consult with staff before acting on these thoughts. Will continue POC.

## 2017-01-14 NOTE — Progress Notes (Signed)
[facility] MD Progress Note  01/14/2017 1:38 PM Carrie Macias  MRN:  [id number] Subjective:   61 yo Caucasian female, widow, lives with her sister. Involuntarily committed by her sister. Reported to have become very depressed lately. She has given up in life. She stopped taking her medications. She is very withdrawn and not taking care of her self. She has attempted suicide twice recently. She overdosed on ambient and then slit her wrist. Patient gave her dog away. Sister is very concerned as patient is at high risk for suicide. She has had multiple losses over the years. Her son passed in 1989, husband in 2011 and her daughter in 2012. Routine labs are essentially within normal limits.   Chart reviewed today. Patient discussed at team.  Staff reports that she is eating with a lot of encouragement. She is drinking more too. Still appears odd and detached. Would not initiate engagement with staff or peers. Continues to express feeling down. No suicidal thoughts. No homicidal thoughts.   Seen today, says she is "ready to go home". Says she has been eating. She feels better. She plans to reengage with the [employer]. Patient denies any side effects. No suicidal thoughts. No associated psychosis or mania.  I encouraged her to allow [place] adjust her medications well enough.   Principal Problem: MDD (major depressive disorder), recurrent severe, without psychosis ([place]) Diagnosis:   Patient Active Problem List   Diagnosis Date Noted  . MDD (major depressive disorder), recurrent severe, without psychosis ([place]) [F33.2] 01/12/2017  . Sepsis (Grand[place]) [A41.9] 12/27/2016  . Dysthymic disorder [F34.1] 12/27/2016  . Protein-calorie malnutrition, severe [E43] 12/27/2016  . Hypertension [I10]   . Thyroid disease [E07.9]   . SVT (supraventricular tachycardia) ([place]) [I47.1]   . Depression with suicidal ideation [F32.9, R45.851] 12/13/2016  . Female cystocele [N81.10] 02/07/2014  . History of vitamin D deficiency  [Z86.39] 01/27/2012  . Hypercholesterolemia [E78.00]   . Back pain, chronic [M54.9, G89.29]    Total Time spent with patient: 30 minutes  Past Psychiatric History: As in H&P  Past Medical History:  Past Medical History:  Diagnosis Date  . Allergy   . Anxiety   . Asthma   . Back pain, chronic   . Cancer (HCC)    THYROID  . Depression   . Detrusor dyssynergia   . Diabetes mellitus    TYPE II  . GERD (gastroesophageal reflux disease)   . Heart murmur   . Hormone disorder    HYPOTHYROIDISM  . Hypercholesterolemia   . Hypertension   . Thyroid disease     Past Surgical History:  Procedure Laterality Date  . ABDOMINAL HYSTERECTOMY     LAVH/RSO/SLING/ANT/COLPO  . COLONOSCOPY  07/16/2010  . EXTERNAL EAR SURGERY    . INNER EAR SURGERY     IMPLANT  . TONSILLECTOMY AND ADENOIDECTOMY    . TUBAL LIGATION     Family History:  Family History  Problem Relation Age of Onset  . Diabetes Mother   . Hypertension Mother   . Heart disease Mother   . Fibromyalgia Mother   . Congestive Heart Failure Mother   . Hypertension Father   . Congestive Heart Failure Father   . Hypertension Sister   . COPD Sister   . OCD Sister   . Fibromyalgia Sister   . Bipolar disorder Sister   . Bipolar disorder Brother   . Bipolar disorder Sister   . Bipolar disorder Maternal Grandfather    Family Psychiatric  History: As  in H&P Social History:  History  Alcohol Use  . 0.0 oz/week    Comment: SELDOM     History  Drug Use No    Social History   Social History  . Marital status: Widowed    Spouse name: N/A  . Number of children: N/A  . Years of education: N/A   Social History Main Topics  . Smoking status: Never Smoker  . Smokeless tobacco: Never Used  . Alcohol use 0.0 oz/week     Comment: SELDOM  . Drug use: No  . Sexual activity: Not Currently    Birth control/ protection: Surgical     Comment: HYST   Other Topics Concern  . None   Social History Narrative  . None    Additional Social History:     Sleep: Poor  Appetite:  Poor  Current Medications: Current Facility-Administered Medications  Medication Dose Route Frequency Provider Last Rate Last Dose  . acetaminophen (TYLENOL) tablet 650 mg  650 mg Oral Q6H PRN Laverle Hobby, PA-C      . alum & mag hydroxide-simeth (MAALOX/MYLANTA) 200-200-20 MG/5ML suspension 30 mL  30 mL Oral Q4H PRN Laverle Hobby, PA-C      . atorvastatin (LIPITOR) tablet 40 mg  40 mg Oral q1800 Laverle Hobby, PA-C   40 mg at 01/13/17 1742  . calcium carbonate (OS-CAL - dosed in mg of elemental calcium) tablet 1,250 mg  1,250 mg Oral BID WC Laverle Hobby, PA-C   1,250 mg at 01/14/17 0755  . cholecalciferol (VITAMIN D) tablet 1,000 Units  1,000 Units Oral Daily Laverle Hobby, PA-C   1,000 Units at 01/14/17 0757  . DULoxetine (CYMBALTA) DR capsule 20 mg  20 mg Oral BID Laverle Hobby, PA-C   20 mg at 01/14/17 0757  . feeding supplement (GLUCERNA SHAKE) (GLUCERNA SHAKE) liquid 237 mL  237 mL Oral TID BM Kerrie Buffalo, NP   Stopped at 01/12/17 1120  . levothyroxine (SYNTHROID, LEVOTHROID) tablet 88 mcg  88 mcg Oral QAC breakfast Laverle Hobby, PA-C   88 mcg at 01/14/17 4709  . losartan (COZAAR) tablet 100 mg  100 mg Oral Daily Laverle Hobby, PA-C   100 mg at 01/14/17 0754  . magnesium hydroxide (MILK OF MAGNESIA) suspension 30 mL  30 mL Oral Daily PRN Laverle Hobby, PA-C   30 mL at 01/12/17 1307  . metFORMIN (GLUCOPHAGE-XR) 24 hr tablet 500 mg  500 mg Oral Q breakfast Laverle Hobby, PA-C   500 mg at 01/14/17 0754  . metoprolol succinate (TOPROL-XL) 24 hr tablet 25 mg  25 mg Oral Daily Laverle Hobby, PA-C   25 mg at 01/14/17 0754  . mirtazapine (REMERON) tablet 15 mg  15 mg Oral QHS Artist Beach, MD   15 mg at 01/13/17 2128  . multivitamin with minerals tablet 1 tablet  1 tablet Oral Daily Laverle Hobby, PA-C   1 tablet at 01/14/17 0755  . saccharomyces boulardii (FLORASTOR) capsule 250 mg  250 mg Oral BID  Laverle Hobby, PA-C   250 mg at 01/14/17 0754  . traMADol (ULTRAM) tablet 50 mg  50 mg Oral Q6H PRN Laverle Hobby, PA-C        Lab Results:  Results for orders placed or performed during the hospital encounter of 01/12/17 (from the past 48 hour(s))  Glucose, capillary     Status: None   Collection Time: 01/12/17  9:34 PM  Result Value  Ref Range   Glucose-Capillary 83 65 - 99 mg/dL  Glucose, capillary     Status: None   Collection Time: 01/13/17  5:54 AM  Result Value Ref Range   Glucose-Capillary 87 65 - 99 mg/dL  Glucose, capillary     Status: Abnormal   Collection Time: 01/13/17  9:25 PM  Result Value Ref Range   Glucose-Capillary 118 (H) 65 - 99 mg/dL  Glucose, capillary     Status: None   Collection Time: 01/14/17  6:26 AM  Result Value Ref Range   Glucose-Capillary 98 65 - 99 mg/dL  Glucose, capillary     Status: None   Collection Time: 01/14/17 11:56 AM  Result Value Ref Range   Glucose-Capillary 92 65 - 99 mg/dL    Blood Alcohol level:  Lab Results  Component Value Date   ETH <5 01/11/2017   ETH <5 99/24/2683    Metabolic Disorder Labs: Lab Results  Component Value Date   HGBA1C 6.1 (H) 12/27/2016   MPG 128 12/27/2016   No results found for: PROLACTIN No results found for: CHOL, TRIG, HDL, CHOLHDL, VLDL, LDLCALC  Physical Findings: AIMS: Facial and Oral Movements Muscles of Facial Expression: None, normal Lips and Perioral Area: None, normal Jaw: None, normal Tongue: None, normal,Extremity Movements Upper (arms, wrists, hands, fingers): None, normal Lower (legs, knees, ankles, toes): None, normal, Trunk Movements Neck, shoulders, hips: None, normal, Overall Severity Severity of abnormal movements (highest score from questions above): None, normal Incapacitation due to abnormal movements: None, normal Patient's awareness of abnormal movements (rate only patient's report): No Awareness, Dental Status Current problems with teeth and/or dentures?:  No Does patient usually wear dentures?: No  CIWA:    COWS:     Musculoskeletal: Strength & Muscle Tone: within normal limits Gait & Station: normal Patient leans: N/A  Psychiatric Specialty Exam: Physical Exam  Constitutional: She is oriented to person, place, and time. She appears well-developed and well-nourished.  HENT:  Head: Normocephalic.  Eyes: Pupils are equal, round, and reactive to light.  Neck: Normal range of motion. Neck supple.  Cardiovascular: Normal rate and regular rhythm.   Respiratory: Effort normal and breath sounds normal.  GI: Soft. Bowel sounds are normal.  Musculoskeletal: Normal range of motion.  Neurological: She is alert and oriented to person, place, and time.  Psychiatric:  As above    ROS  Blood pressure (!) 150/71, pulse 70, temperature 97.7 F (36.5 C), resp. rate 16, height 5\' 5"  (1.651 m), weight 51.7 kg (114 lb).Body mass index is 18.97 kg/m.  General Appearance: Out at the day room more. Neatly dressed. Mobilizing positive affect appropriately. Not internally stimulated.   Eye Contact:  Better  Speech:  Spontaneous. Low tone and rrate  Volume:  Decreased  Mood:  Less depressed  Affect:  Restricted but more reactive  Thought Process:  Linear  Orientation:  Full (Time, Place, and Person)  Thought Content:  No negative ruminations. No delusional theme. No preoccupation with violent thoughts. No obsession.  No hallucination in any modality.   Suicidal Thoughts:  Denies any suicidal thoughts  Homicidal Thoughts:  No  Memory:  WNL  Judgement:  Fair  Insight:  Good  Psychomotor Activity:  Better  Concentration:  Good  Recall:  [place] of Knowledge:  Good  Language:  Good  Akathisia:  No  Handed:    AIMS (if indicated):     Assets:  Desire for Improvement Housing  ADL's:  Impaired  Cognition:  WNL  Sleep:  Number of Hours: 6.75     Treatment Plan Summary: Patient is responding to treatment. We are still adjusting her  medications.    Psychiatric: MDD Recurrent  Medical: Thyroid disease HTN COPD DM  Psychosocial:  Multiple losses Widow   PLAN: 1. Increase Duloxetine to 60 mg daily from tomorrow.  2. Encourage unit groups and activities 3. Continue to monitor mood, behavior and interaction with peers 4. SW would coordinate aftercare.    Artist Beach, MD 01/14/2017, 1:38 PMPatient ID: Carrie Macias, female   DOB: 10-12-1955, 61 y.o.   MRN: [id number]

## 2017-01-14 NOTE — Plan of Care (Signed)
Problem: Medication: Goal: Compliance with prescribed medication regimen will improve Outcome: Progressing Pt is compliant with medication regimen  Problem: Metabolic: Goal: Ability to maintain appropriate glucose levels will improve Outcome: Progressing Pt CBG has been within normal limits  Problem: Nutritional: Goal: Maintenance of adequate nutrition will improve Outcome: Progressing Pt is eating more  She ate breakfast and dinner and snack

## 2017-01-15 LAB — GLUCOSE, CAPILLARY
GLUCOSE-CAPILLARY: 94 mg/dL (ref 65–99)
Glucose-Capillary: 88 mg/dL (ref 65–99)

## 2017-01-15 MED ORDER — MIRTAZAPINE 30 MG PO TABS
30.0000 mg | ORAL_TABLET | Freq: Every day | ORAL | Status: DC
  Administered 2017-01-15 – 2017-01-16 (×2): 30 mg via ORAL
  Filled 2017-01-15 (×5): qty 1

## 2017-01-15 NOTE — Plan of Care (Signed)
Problem: Safety: Goal: Periods of time without injury will increase Outcome: Progressing Pt. denies SI/HI/AVH at this time, remains a moderate fall risk, Q 15 checks in effect.

## 2017-01-15 NOTE — Progress Notes (Signed)
[facility] MD Progress Note  01/15/2017 4:46 PM Carrie Macias  MRN:  [id number] Subjective:   61 yo Caucasian female, widow, lives with her sister. Involuntarily committed by her sister. Reported to have become very depressed lately. She has given up in life. She stopped taking her medications. She is very withdrawn and not taking care of her self. She has attempted suicide twice recently. She overdosed on ambient and then slit her wrist. Patient gave her dog away. Sister is very concerned as patient is at high risk for suicide. She has had multiple losses over the years. Her son passed in 1989, husband in 2011 and her daughter in 2012. Routine labs are essentially within normal limits.   Chart reviewed today. Patient discussed at team.  Staff reports that she coming out more. She is eating better. She is grooming self. Stays to self most of the time but pleasant when engaged.   Seen today, reports feeling more motivated and more energetic. Feels her depression is lifting. Says she has been eating more. Her sister has been visiting. Says her family wants her to get better and come home. Patient denies any recent thoughts of giving up. No suicidal or homicidal thoughts. Feels she is improving. We have agreed to adjust her medications further today.   Principal Problem: MDD (major depressive disorder), recurrent severe, without psychosis ([place]) Diagnosis:   Patient Active Problem List   Diagnosis Date Noted  . MDD (major depressive disorder), recurrent severe, without psychosis ([place]) [F33.2] 01/12/2017  . Sepsis ([place]) [A41.9] 12/27/2016  . Dysthymic disorder [F34.1] 12/27/2016  . Protein-calorie malnutrition, severe [E43] 12/27/2016  . Hypertension [I10]   . Thyroid disease [E07.9]   . SVT (supraventricular tachycardia) ([place]) [I47.1]   . Depression with suicidal ideation [F32.9, R45.851] 12/13/2016  . Female cystocele [N81.10] 02/07/2014  . History of vitamin D deficiency [Z86.39] 01/27/2012  .  Hypercholesterolemia [E78.00]   . Back pain, chronic [M54.9, G89.29]    Total Time spent with patient: 30 minutes  Past Psychiatric History: As in H&P  Past Medical History:  Past Medical History:  Diagnosis Date  . Allergy   . Anxiety   . Asthma   . Back pain, chronic   . Cancer (HCC)    THYROID  . Depression   . Detrusor dyssynergia   . Diabetes mellitus    TYPE II  . GERD (gastroesophageal reflux disease)   . Heart murmur   . Hormone disorder    HYPOTHYROIDISM  . Hypercholesterolemia   . Hypertension   . Thyroid disease     Past Surgical History:  Procedure Laterality Date  . ABDOMINAL HYSTERECTOMY     LAVH/RSO/SLING/ANT/COLPO  . COLONOSCOPY  07/16/2010  . EXTERNAL EAR SURGERY    . INNER EAR SURGERY     IMPLANT  . TONSILLECTOMY AND ADENOIDECTOMY    . TUBAL LIGATION     Family History:  Family History  Problem Relation Age of Onset  . Diabetes Mother   . Hypertension Mother   . Heart disease Mother   . Fibromyalgia Mother   . Congestive Heart Failure Mother   . Hypertension Father   . Congestive Heart Failure Father   . Hypertension Sister   . COPD Sister   . OCD Sister   . Fibromyalgia Sister   . Bipolar disorder Sister   . Bipolar disorder Brother   . Bipolar disorder Sister   . Bipolar disorder Maternal Grandfather    Family Psychiatric  History: As in H&P  Social History:  History  Alcohol Use  . 0.0 oz/week    Comment: SELDOM     History  Drug Use No    Social History   Social History  . Marital status: Widowed    Spouse name: N/A  . Number of children: N/A  . Years of education: N/A   Social History Main Topics  . Smoking status: Never Smoker  . Smokeless tobacco: Never Used  . Alcohol use 0.0 oz/week     Comment: SELDOM  . Drug use: No  . Sexual activity: Not Currently    Birth control/ protection: Surgical     Comment: HYST   Other Topics Concern  . None   Social History Narrative  . None   Additional Social History:      Sleep: Poor  Appetite:  Poor  Current Medications: Current Facility-Administered Medications  Medication Dose Route Frequency Provider Last Rate Last Dose  . acetaminophen (TYLENOL) tablet 650 mg  650 mg Oral Q6H PRN Laverle Hobby, PA-C      . alum & mag hydroxide-simeth (MAALOX/MYLANTA) 200-200-20 MG/5ML suspension 30 mL  30 mL Oral Q4H PRN Laverle Hobby, PA-C      . atorvastatin (LIPITOR) tablet 40 mg  40 mg Oral q1800 Laverle Hobby, PA-C   40 mg at 01/14/17 1729  . calcium carbonate (OS-CAL - dosed in mg of elemental calcium) tablet 1,250 mg  1,250 mg Oral BID WC Laverle Hobby, PA-C   1,250 mg at 01/15/17 0803  . cholecalciferol (VITAMIN D) tablet 1,000 Units  1,000 Units Oral Daily Laverle Hobby, PA-C   1,000 Units at 01/15/17 7619  . DULoxetine (CYMBALTA) DR capsule 60 mg  60 mg Oral Daily Artist Beach, MD   60 mg at 01/15/17 0804  . feeding supplement (GLUCERNA SHAKE) (GLUCERNA SHAKE) liquid 237 mL  237 mL Oral TID BM Kerrie Buffalo, NP   Stopped at 01/12/17 1120  . levothyroxine (SYNTHROID, LEVOTHROID) tablet 88 mcg  88 mcg Oral QAC breakfast Laverle Hobby, PA-C   88 mcg at 01/15/17 5093  . losartan (COZAAR) tablet 100 mg  100 mg Oral Daily Laverle Hobby, PA-C   100 mg at 01/15/17 0804  . magnesium hydroxide (MILK OF MAGNESIA) suspension 30 mL  30 mL Oral Daily PRN Laverle Hobby, PA-C   30 mL at 01/12/17 1307  . metFORMIN (GLUCOPHAGE-XR) 24 hr tablet 500 mg  500 mg Oral Q breakfast Laverle Hobby, PA-C   500 mg at 01/15/17 2671  . metoprolol succinate (TOPROL-XL) 24 hr tablet 25 mg  25 mg Oral Daily Laverle Hobby, PA-C   25 mg at 01/15/17 2458  . mirtazapine (REMERON) tablet 15 mg  15 mg Oral QHS Artist Beach, MD   15 mg at 01/14/17 2102  . multivitamin with minerals tablet 1 tablet  1 tablet Oral Daily Laverle Hobby, PA-C   1 tablet at 01/15/17 0804  . saccharomyces boulardii (FLORASTOR) capsule 250 mg  250 mg Oral BID Laverle Hobby, PA-C   250  mg at 01/15/17 0804  . traMADol (ULTRAM) tablet 50 mg  50 mg Oral Q6H PRN Laverle Hobby, PA-C        Lab Results:  Results for orders placed or performed during the hospital encounter of 01/12/17 (from the past 48 hour(s))  Glucose, capillary     Status: Abnormal   Collection Time: 01/13/17  9:25 PM  Result Value Ref Range  Glucose-Capillary 118 (H) 65 - 99 mg/dL  Glucose, capillary     Status: None   Collection Time: 01/14/17  6:26 AM  Result Value Ref Range   Glucose-Capillary 98 65 - 99 mg/dL  Glucose, capillary     Status: None   Collection Time: 01/14/17 11:56 AM  Result Value Ref Range   Glucose-Capillary 92 65 - 99 mg/dL  Glucose, capillary     Status: None   Collection Time: 01/15/17  4:36 PM  Result Value Ref Range   Glucose-Capillary 94 65 - 99 mg/dL    Blood Alcohol level:  Lab Results  Component Value Date   ETH <5 01/11/2017   ETH <5 02/54/2706    Metabolic Disorder Labs: Lab Results  Component Value Date   HGBA1C 6.1 (H) 12/27/2016   MPG 128 12/27/2016   No results found for: PROLACTIN No results found for: CHOL, TRIG, HDL, CHOLHDL, VLDL, LDLCALC  Physical Findings: AIMS: Facial and Oral Movements Muscles of Facial Expression: None, normal Lips and Perioral Area: None, normal Jaw: None, normal Tongue: None, normal,Extremity Movements Upper (arms, wrists, hands, fingers): None, normal Lower (legs, knees, ankles, toes): None, normal, Trunk Movements Neck, shoulders, hips: None, normal, Overall Severity Severity of abnormal movements (highest score from questions above): None, normal Incapacitation due to abnormal movements: None, normal Patient's awareness of abnormal movements (rate only patient's report): No Awareness, Dental Status Current problems with teeth and/or dentures?: No Does patient usually wear dentures?: No  CIWA:    COWS:     Musculoskeletal: Strength & Muscle Tone: within normal limits Gait & Station: normal Patient leans:  N/A  Psychiatric Specialty Exam: Physical Exam  Constitutional: She is oriented to person, place, and time. She appears well-developed and well-nourished.  HENT:  Head: Normocephalic.  Eyes: Pupils are equal, round, and reactive to light.  Neck: Normal range of motion. Neck supple.  Cardiovascular: Normal rate and regular rhythm.   Respiratory: Effort normal and breath sounds normal.  GI: Soft. Bowel sounds are normal.  Musculoskeletal: Normal range of motion.  Neurological: She is alert and oriented to person, place, and time.  Psychiatric:  As above    ROS  Blood pressure 118/72, pulse 82, temperature 97.7 F (36.5 C), resp. rate 18, height 5\' 5"  (1.651 m), weight 51.7 kg (114 lb).Body mass index is 18.97 kg/m.  General Appearance: Neatly dressed. Good relatedness. Appropriate behavior  Eye Contact:  Good  Speech:  Spontaneous. Normal tone and rate  Volume:  Normal   Mood:  Less depressed  Affect:  Restricted but mobilizing positive affect  Thought Process:  Linear  Orientation:  Full (Time, Place, and Person)  Thought Content:  No negative ruminations. No delusional theme. No preoccupation with violent thoughts. No obsession.  No hallucination in any modality.   Suicidal Thoughts:  Denies any suicidal thoughts  Homicidal Thoughts:  No  Memory:  WNL  Judgement:  Fair  Insight:  Good  Psychomotor Activity:  Better  Concentration:  Good  Recall:  [place] of Knowledge:  Good  Language:  Good  Akathisia:  No  Handed:    AIMS (if indicated):     Assets:  Desire for Improvement Housing  ADL's:  Impaired  Cognition:  WNL  Sleep:  Number of Hours: 6.75     Treatment Plan Summary: Patient is responding to treatment. We plan to adjust her medications further today. Marland Kitchen    Psychiatric: MDD Recurrent  Medical: Thyroid disease HTN COPD DM  Psychosocial:  Multiple losses Widow   PLAN: 1. Increase Mirtazapine to 30 mg HS  2. Encourage unit groups and  activities 3. Continue to monitor mood, behavior and interaction with peers 4. SW would coordinate aftercare.    Artist Beach, MD 01/15/2017, 4:46 PMPatient ID: Carrie Macias, female   DOB: 06-20-1956, 61 y.o.   MRN: [id number] Patient ID: AVITAL DANCY, female   DOB: [birth year], 61 y.o.   MRN: [id number]

## 2017-01-15 NOTE — Progress Notes (Signed)
Pt attend wrap up group. Her day was a  7. Pt said her goal be patient stay up attend all groups meeting. She need to learn while she's here.

## 2017-01-15 NOTE — [facility] Group Notes (Signed)
[facility] Group Notes:  (Nursing Education)  Date:  01/15/2017  Time:  0915 Type of Therapy:  Nurse Education  Participation Level:  Active  Participation Quality:  Appropriate and Attentive  Affect:  Depressed and Flat  Cognitive:  Alert, Appropriate and Oriented  Insight:  Appropriate  Engagement in Group:  Engaged  Modes of Intervention:  Discussion, Education and Support  Summary of Progress/Problems: Verbally contributed to discussion.  [place] Police 01/15/2017, [phone]

## 2017-01-15 NOTE — Progress Notes (Signed)
DATA ACTION RESPONSE  Objective- Pt. is visible in the dayroom, seen coloring. Pt. is making effort to be present more. Presents with a depressed/sad affect and mood. Pt. forwards little with interaction.  Subjective- Denies having any SI/HI/AVH/Pain at this time. Pt. states "I'm bored so I am going to try to color". Continues to be cooperative and remain safe & pleasant on the unit.  1:1 interaction in private to establish rapport. Encouragement, education, & support given from staff. Meds. ordered and administered. Glucerna offered and refused. Continues to encourage Pt. on maintaining adequate hydration and diet.   Safety maintained with Q 15 checks. Continues to follow treatment plan and will monitor closely. No additonal questions/concerns noted.

## 2017-01-15 NOTE — Progress Notes (Signed)
D: A & O X4. Presents with flat affect and depressed mood. Guarded but is pleasant, forwards on interactions. Denies SI, HI, AVH and pain when assessed "Not right now honey". Visible in dayroom for majority of this shift. Reports fair sleep , fair appetite, normal energy and good concentration level. Rates her depression, hopelessness and anxiety all 3/10. Pt attended scheduled groups.  A: Scheduled medications given as prescribed, effects monitored. Q 15 minutes safety checks maintained. Encouraged pt to voice concerns, comply with medications and attend groups.  R: Pt compliant with medications when offered. Denies adverse drug reactions. Remains safe on and off unit. Denies concerns at this time.

## 2017-01-15 NOTE — [facility] Group Notes (Signed)
Adult Therapy Group Note  Date:  01/15/2017  Time:  11:00AM-11:45AM  Group Topic/Focus: Unhealthy vs Healthy Coping Techniques  Building Self Esteem:    The focus of this group was first to talk about various patients' feelings about being hospitalized, and then to determine what unhealthy coping techniques typically are used or and what healthy coping techniques would be helpful in coping with various problems. Patients were guided in becoming aware of the differences between healthy and unhealthy coping techniques.  Vignettes were used to generate ideas, which led to a discussion about personal application.     Participation Level:  Active  Participation Quality:  Attentive  Affect:  Flat  Cognitive:  Appropriate  Insight: Appropriate  Engagement in Group:  Developing/Improving  Modes of Intervention:  Exercise, Discussion and Support  Additional Comments:  The patient expressed that she is not happy about being in the hospital, and that she is using the healthy coping skill of prayer to help her adjust.  She did not talk much during group, but was appropriate when she did, and laughed at appropriate times.  Selmer Dominion, LCSW 01/15/2017   12:40 PM

## 2017-01-16 LAB — GLUCOSE, CAPILLARY
GLUCOSE-CAPILLARY: 100 mg/dL — AB (ref 65–99)
GLUCOSE-CAPILLARY: 96 mg/dL (ref 65–99)
GLUCOSE-CAPILLARY: 78 mg/dL (ref 65–99)
Glucose-Capillary: 130 mg/dL — ABNORMAL HIGH (ref 65–99)

## 2017-01-16 NOTE — Progress Notes (Signed)
[facility] MD Progress Note  01/16/2017 1:43 PM Carrie Macias  MRN:  [id number] Subjective:   61 yo Caucasian female, widow, lives with her sister. Involuntarily committed by her sister. Reported to have become very depressed lately. She has given up in life. She stopped taking her medications. She is very withdrawn and not taking care of her self. She has attempted suicide twice recently. She overdosed on ambient and then slit her wrist. Patient gave her dog away. Sister is very concerned as patient is at high risk for suicide. She has had multiple losses over the years. Her son passed in 1989, husband in 2011 and her daughter in 2012. Routine labs are essentially within normal limits.   Chart reviewed today. Patient discussed at team.  Staff reports that she is participating at groups. Mobilizing better affect and genuinely engaging with task. No behavioral issues. Not withdrawn. She is grooming self. She is eating her meals and drinks enough fluids. She has been tolerating her medications well.   Seen today, tolerating recent medication adjustment well. Reports good spirits. Says she feels more motivated. Now able to enjoy things. Says she is no longer feeling passive about life. She hopes to be home soon. No suicidal thoughts. Says her appetite is much better.  Principal Problem: MDD (major depressive disorder), recurrent severe, without psychosis ([place]) Diagnosis:   Patient Active Problem List   Diagnosis Date Noted  . MDD (major depressive disorder), recurrent severe, without psychosis ([place]) [F33.2] 01/12/2017  . Sepsis ([place]) [A41.9] 12/27/2016  . Dysthymic disorder [F34.1] 12/27/2016  . Protein-calorie malnutrition, severe [E43] 12/27/2016  . Hypertension [I10]   . Thyroid disease [E07.9]   . SVT (supraventricular tachycardia) ([place]) [I47.1]   . Depression with suicidal ideation [F32.9, R45.851] 12/13/2016  . Female cystocele [N81.10] 02/07/2014  . History of vitamin D deficiency [Z86.39]  01/27/2012  . Hypercholesterolemia [E78.00]   . Back pain, chronic [M54.9, G89.29]    Total Time spent with patient: 30 minutes  Past Psychiatric History: As in H&P  Past Medical History:  Past Medical History:  Diagnosis Date  . Allergy   . Anxiety   . Asthma   . Back pain, chronic   . Cancer (HCC)    THYROID  . Depression   . Detrusor dyssynergia   . Diabetes mellitus    TYPE II  . GERD (gastroesophageal reflux disease)   . Heart murmur   . Hormone disorder    HYPOTHYROIDISM  . Hypercholesterolemia   . Hypertension   . Thyroid disease     Past Surgical History:  Procedure Laterality Date  . ABDOMINAL HYSTERECTOMY     LAVH/RSO/SLING/ANT/COLPO  . COLONOSCOPY  07/16/2010  . EXTERNAL EAR SURGERY    . INNER EAR SURGERY     IMPLANT  . TONSILLECTOMY AND ADENOIDECTOMY    . TUBAL LIGATION     Family History:  Family History  Problem Relation Age of Onset  . Diabetes Mother   . Hypertension Mother   . Heart disease Mother   . Fibromyalgia Mother   . Congestive Heart Failure Mother   . Hypertension Father   . Congestive Heart Failure Father   . Hypertension Sister   . COPD Sister   . OCD Sister   . Fibromyalgia Sister   . Bipolar disorder Sister   . Bipolar disorder Brother   . Bipolar disorder Sister   . Bipolar disorder Maternal Grandfather    Family Psychiatric  History: As in H&P Social History:  History  Alcohol Use  . 0.0 oz/week    Comment: SELDOM     History  Drug Use No    Social History   Social History  . Marital status: Widowed    Spouse name: N/A  . Number of children: N/A  . Years of education: N/A   Social History Main Topics  . Smoking status: Never Smoker  . Smokeless tobacco: Never Used  . Alcohol use 0.0 oz/week     Comment: SELDOM  . Drug use: No  . Sexual activity: Not Currently    Birth control/ protection: Surgical     Comment: HYST   Other Topics Concern  . None   Social History Narrative  . None   Additional  Social History:     Sleep: Good  Appetite:  Good  Current Medications: Current Facility-Administered Medications  Medication Dose Route Frequency Provider Last Rate Last Dose  . acetaminophen (TYLENOL) tablet 650 mg  650 mg Oral Q6H PRN Laverle Hobby, PA-C      . alum & mag hydroxide-simeth (MAALOX/MYLANTA) 200-200-20 MG/5ML suspension 30 mL  30 mL Oral Q4H PRN Laverle Hobby, PA-C      . atorvastatin (LIPITOR) tablet 40 mg  40 mg Oral q1800 Laverle Hobby, PA-C   40 mg at 01/15/17 1705  . calcium carbonate (OS-CAL - dosed in mg of elemental calcium) tablet 1,250 mg  1,250 mg Oral BID WC Laverle Hobby, PA-C   1,250 mg at 01/16/17 6767  . cholecalciferol (VITAMIN D) tablet 1,000 Units  1,000 Units Oral Daily Laverle Hobby, PA-C   1,000 Units at 01/16/17 2094  . DULoxetine (CYMBALTA) DR capsule 60 mg  60 mg Oral Daily Artist Beach, MD   60 mg at 01/16/17 0828  . feeding supplement (GLUCERNA SHAKE) (GLUCERNA SHAKE) liquid 237 mL  237 mL Oral TID BM Kerrie Buffalo, NP   Stopped at 01/12/17 1120  . levothyroxine (SYNTHROID, LEVOTHROID) tablet 88 mcg  88 mcg Oral QAC breakfast Laverle Hobby, PA-C   88 mcg at 01/16/17 7096  . losartan (COZAAR) tablet 100 mg  100 mg Oral Daily Laverle Hobby, PA-C   100 mg at 01/16/17 2836  . magnesium hydroxide (MILK OF MAGNESIA) suspension 30 mL  30 mL Oral Daily PRN Laverle Hobby, PA-C   30 mL at 01/12/17 1307  . metFORMIN (GLUCOPHAGE-XR) 24 hr tablet 500 mg  500 mg Oral Q breakfast Laverle Hobby, PA-C   500 mg at 01/16/17 6294  . metoprolol succinate (TOPROL-XL) 24 hr tablet 25 mg  25 mg Oral Daily Laverle Hobby, PA-C   25 mg at 01/16/17 7654  . mirtazapine (REMERON) tablet 30 mg  30 mg Oral QHS Artist Beach, MD   30 mg at 01/15/17 2109  . multivitamin with minerals tablet 1 tablet  1 tablet Oral Daily Laverle Hobby, PA-C   1 tablet at 01/16/17 6503  . saccharomyces boulardii (FLORASTOR) capsule 250 mg  250 mg Oral BID Laverle Hobby, PA-C   250 mg at 01/16/17 5465  . traMADol (ULTRAM) tablet 50 mg  50 mg Oral Q6H PRN Laverle Hobby, PA-C        Lab Results:  Results for orders placed or performed during the hospital encounter of 01/12/17 (from the past 48 hour(s))  Glucose, capillary     Status: None   Collection Time: 01/15/17  4:36 PM  Result Value Ref Range   Glucose-Capillary 94 65 -  99 mg/dL  Glucose, capillary     Status: None   Collection Time: 01/15/17  9:07 PM  Result Value Ref Range   Glucose-Capillary 88 65 - 99 mg/dL   Comment 1 Notify RN   Glucose, capillary     Status: Abnormal   Collection Time: 01/16/17  6:20 AM  Result Value Ref Range   Glucose-Capillary 100 (H) 65 - 99 mg/dL  Glucose, capillary     Status: None   Collection Time: 01/16/17 11:50 AM  Result Value Ref Range   Glucose-Capillary 96 65 - 99 mg/dL   Comment 1 Notify RN    Comment 2 Document in Chart     Blood Alcohol level:  Lab Results  Component Value Date   ETH <5 01/11/2017   ETH <5 85/27/7824    Metabolic Disorder Labs: Lab Results  Component Value Date   HGBA1C 6.1 (H) 12/27/2016   MPG 128 12/27/2016   No results found for: PROLACTIN No results found for: CHOL, TRIG, HDL, CHOLHDL, VLDL, LDLCALC  Physical Findings: AIMS: Facial and Oral Movements Muscles of Facial Expression: None, normal Lips and Perioral Area: None, normal Jaw: None, normal Tongue: None, normal,Extremity Movements Upper (arms, wrists, hands, fingers): None, normal Lower (legs, knees, ankles, toes): None, normal, Trunk Movements Neck, shoulders, hips: None, normal, Overall Severity Severity of abnormal movements (highest score from questions above): None, normal Incapacitation due to abnormal movements: None, normal Patient's awareness of abnormal movements (rate only patient's report): No Awareness, Dental Status Current problems with teeth and/or dentures?: No Does patient usually wear dentures?: No  CIWA:    COWS:      Musculoskeletal: Strength & Muscle Tone: within normal limits Gait & Station: normal Patient leans: N/A  Psychiatric Specialty Exam: Physical Exam  Constitutional: She is oriented to person, place, and time. She appears well-developed and well-nourished.  HENT:  Head: Normocephalic.  Eyes: Pupils are equal, round, and reactive to light.  Neck: Normal range of motion. Neck supple.  Cardiovascular: Normal rate and regular rhythm.   Respiratory: Effort normal and breath sounds normal.  GI: Soft. Bowel sounds are normal.  Musculoskeletal: Normal range of motion.  Neurological: She is alert and oriented to person, place, and time.  Psychiatric:  As above    ROS  Blood pressure 110/70, pulse 68, temperature 98.3 F (36.8 C), temperature source Oral, resp. rate 18, height 5\' 5"  (1.651 m), weight 51.7 kg (114 lb).Body mass index is 18.97 kg/m.  General Appearance: Neatly dressed. Good relatedness. Appropriate behavior. Calm and cooperative. Appropriate behavior.   Eye Contact:  Good  Speech:  Spontaneous. Normal tone and rate  Volume:  Normal   Mood:  Feels good  Affect:  Full range and appropriate.   Thought Process:  Linear  Orientation:  Full (Time, Place, and Person)  Thought Content:  Future oriented. No negative ruminations. No delusional theme. No preoccupation with violent thoughts. No obsession.  No hallucination in any modality.   Suicidal Thoughts:  None  Homicidal Thoughts:  No  Memory:  WNL  Judgement:  Good  Insight:  Good  Psychomotor Activity:  Normal  Concentration:  Good  Recall:  [place] of Knowledge:  Good  Language:  Good  Akathisia:  No  Handed:    AIMS (if indicated):     Assets:  Desire for Improvement Housing  ADL's:  Impaired  Cognition:  WNL  Sleep:  Number of Hours: 6.75     Treatment Plan Summary: Patient is responding  to treatment. She is not a danger to self or others. She is future oriented. Hopeful discharge tomorrow.    Psychiatric: MDD Recurrent  Medical: Thyroid disease HTN COPD DM  Psychosocial:  Multiple losses Widow   PLAN: 1. Continue current regimen 2. Continue to monitor mood, behavior and interaction with peers 3. Home tomorrow.    Artist Beach, MD 01/16/2017, 1:43 PMPatient ID: Carrie Macias, female   DOB: 09/26/1955, 61 y.o.   MRN: [id number] Patient ID: Carrie Macias, female   DOB: 01-10-1956, 61 y.o.   MRN: [id number] Patient ID: Carrie Macias, female   DOB: 1956/02/12, 61 y.o.   MRN: [id number]

## 2017-01-16 NOTE — [facility] Group Notes (Signed)
[facility] Group Notes:  (Clinical Social Work)  01/16/2017   10:00AM-11:00AM  Summary of Progress/Problems:  The main focus of today's process group was to listen to a variety of genres of music and to identify that different types of music provoke different responses.  The patient then was able to identify personally what was soothing for them, as well as energizing, as well as how patient can personally use this knowledge in sleep habits, with depression, and with other symptoms.  The patient expressed at the beginning of group the overall feeling of anxiety, which she stated had increased in the last few minutes due to sudden heartburn.  She rated her anxiety a 5 on a 1-10 scale.  She listened quietly, with her eyes closed, moving subtly to the music and smiling.  At the end of group she stated her anxiety was completely gone.  Type of Therapy:  Music Therapy   Participation Level:  Active  Participation Quality:  Attentive   Affect:  Blunted  Cognitive:  Oriented  Insight:  Engaged  Engagement in Therapy:  Engaged  Modes of Intervention:   Activity, Exploration  Selmer Dominion, LCSW 01/16/2017

## 2017-01-16 NOTE — Plan of Care (Signed)
Problem: Education: Goal: Utilization of techniques to improve thought processes will improve Outcome: Progressing Nurse discussed anxiety/depression/coping skills with patient.    

## 2017-01-16 NOTE — [facility] Group Notes (Signed)
[facility] Group Notes:  (Nursing/MHT/Case Management/Adjunct)  Date:  01/16/2017  Time: 1:15 p.m.  Type of Therapy:  Nurse Education  Participation Level:  Active  Participation Quality:  Appropriate  Affect:  Appropriate  Cognitive:  Alert  Insight:  Appropriate  Engagement in Group:  Engaged  Modes of Intervention:  Discussion and Education  Summary of Progress/Problems:  This is coping skills and discharge goals group.    Cammy Copa 01/16/2017, 2:32 PM

## 2017-01-16 NOTE — Progress Notes (Signed)
D:  Patient's self inventory, patient sleeps good, no sleep medication given.  Good appetite, normal energy level, good concentration.  Rated depression, hopeless and anxiety #1.  Denied withdrawals.  Denied SI.  Denied physical problems.  Denied pain.  Goal is discharge.  Plans to eat and take meds.   A:  Medications administered per MD orders.  Emotional support and encouragement given patient. R:  Denied SI and HI, contracts for safety.  Denied A/V hallucinations.  Safety maintained with 15 minute checks.

## 2017-01-16 NOTE — Progress Notes (Signed)
[facility] Group Notes:  (Nursing/MHT/Case Management/Adjunct)  Date:  01/16/2017  Time:  2030 Type of Therapy:  wrap up group  Participation Level:  Active  Participation Quality:  Appropriate, Attentive, Sharing and Supportive  Affect:  Flat  Cognitive:  Appropriate  Insight:  Improving  Engagement in Group:  Engaged  Modes of Intervention:  Clarification, Education and Support  Summary of Progress/Problems: Pt reported having a good visit with her sisters with whom she lives with one and the other lives outside of [place]. Pt shared that if she could change one thing it would be to have her family back. Pt shared with group that  her husband and two children have passed away and she is a cancer survivor. Pt reports being grateful for religion her struggle today was heartburn.   Shellia Cleverly 01/16/2017, 10:42 PM

## 2017-01-17 LAB — GLUCOSE, CAPILLARY
GLUCOSE-CAPILLARY: 77 mg/dL (ref 65–99)
GLUCOSE-CAPILLARY: 106 mg/dL — AB (ref 65–99)
GLUCOSE-CAPILLARY: 97 mg/dL (ref 65–99)
Glucose-Capillary: 83 mg/dL (ref 65–99)

## 2017-01-17 MED ORDER — LEVOTHYROXINE SODIUM 88 MCG PO TABS: 88.0000 ug | ORAL_TABLET | Freq: Every day | ORAL | Status: AC

## 2017-01-17 MED ORDER — LOSARTAN POTASSIUM 100 MG PO TABS: 100.0000 mg | ORAL_TABLET | Freq: Every day | ORAL | Status: AC

## 2017-01-17 MED ORDER — SACCHAROMYCES BOULARDII 250 MG PO CAPS: ORAL_CAPSULE | ORAL | 0 refills | Status: AC

## 2017-01-17 MED ORDER — CALCIUM CARBONATE 600 MG PO TABS: 600.0000 mg | ORAL_TABLET | Freq: Two times a day (BID) | ORAL | Status: AC

## 2017-01-17 MED ORDER — ONE-DAILY MULTI VITAMINS PO TABS: 1.0000 | ORAL_TABLET | Freq: Every day | ORAL | Status: AC

## 2017-01-17 MED ORDER — METFORMIN HCL ER 500 MG PO TB24: 500.0000 mg | ORAL_TABLET | Freq: Every day | ORAL | 0 refills | Status: AC

## 2017-01-17 MED ORDER — METOPROLOL SUCCINATE ER 25 MG PO TB24: 25.0000 mg | ORAL_TABLET | Freq: Every day | ORAL | 0 refills | Status: AC

## 2017-01-17 MED ORDER — MIRTAZAPINE 30 MG PO TABS: 30.0000 mg | ORAL_TABLET | Freq: Every day | ORAL | 0 refills | Status: AC

## 2017-01-17 MED ORDER — VITAMIN D 1000 UNITS PO TABS: 1000.0000 [IU] | ORAL_TABLET | Freq: Every day | ORAL | Status: AC

## 2017-01-17 MED ORDER — DULOXETINE HCL 60 MG PO CPEP: 60.0000 mg | ORAL_CAPSULE | Freq: Every day | ORAL | 0 refills | Status: AC

## 2017-01-17 MED ORDER — TRAMADOL HCL 50 MG PO TABS: 50.0000 mg | ORAL_TABLET | Freq: Four times a day (QID) | ORAL | 0 refills | Status: AC | PRN

## 2017-01-17 MED ORDER — SIMVASTATIN 80 MG PO TABS: 80.0000 mg | ORAL_TABLET | Freq: Every day | ORAL | Status: AC

## 2017-01-17 NOTE — Progress Notes (Signed)
  [facility] Adult Case Management Discharge Plan :  Will you be returning to the same living situation after discharge:  Yes,  home At discharge, do you have transportation home?: Yes,  sister Do you have the ability to pay for your medications: Yes,  insurance  Release of information consent forms completed and in the chart;  Patient's signature needed at discharge.  Patient to Follow up at: Follow-up Information    [facility] Follow up on 01/20/2017.   Specialty:  Behavioral Health Why:  Thursday at 8:45 AM with Dellia Nims for the IOP program.  Rita's direct number is [phone] Contact information: [address] [phone]          Next level of care provider has access to [facility] and Suicide Prevention discussed: Yes,  yes  Have you used any form of tobacco in the last 30 days? (Cigarettes, Smokeless Tobacco, Cigars, and/or Pipes): No  Has patient been referred to the Quitline?: N/A patient is not a smoker  Patient has been referred for addiction treatment: [facility] 01/17/2017, 12:13 PM

## 2017-01-17 NOTE — Discharge Summary (Signed)
Physician Discharge Summary Note  Patient:  Carrie Macias is an 61 y.o., female MRN:  [id number] DOB:  [birth year] Patient phone:  [phone] (home)  Patient address:   [place] [address],  Total Time spent with patient: Greater than 30 minutes  Date of Admission:  01/12/2017 Date of Discharge:  01-17-17  Reason for Admission: Suicide attempts by overdose & self-inflicted cuts.  Principal Problem: MDD (major depressive disorder), recurrent severe, without psychosis ([place]) D ischarge Diagnoses: Patient Active Problem List   Diagnosis Date Noted  . MDD (major depressive disorder), recurrent severe, without psychosis ([place]) [F33.2] 01/12/2017  . Sepsis ([place]) [A41.9] 12/27/2016  . Dysthymic disorder [F34.1] 12/27/2016  . Protein-calorie malnutrition, severe [E43] 12/27/2016  . Hypertension [I10]   . Thyroid disease [E07.9]   . SVT (supraventricular tachycardia) ([place]) [I47.1]   . Depression with suicidal ideation [F32.9, R45.851] 12/13/2016  . Female cystocele [N81.10] 02/07/2014  . History of vitamin D deficiency [Z86.39] 01/27/2012  . Hypercholesterolemia [E78.00]   . Back pain, chronic [M54.9, G89.29]    Past Psychiatric History: Major depressive disorder, recurrent  Past Medical History:  Past Medical History:  Diagnosis Date  . Allergy   . Anxiety   . Asthma   . Back pain, chronic   . Cancer (HCC)    THYROID  . Depression   . Detrusor dyssynergia   . Diabetes mellitus    TYPE II  . GERD (gastroesophageal reflux disease)   . Heart murmur   . Hormone disorder    HYPOTHYROIDISM  . Hypercholesterolemia   . Hypertension   . Thyroid disease     Past Surgical History:  Procedure Laterality Date  . ABDOMINAL HYSTERECTOMY     LAVH/RSO/SLING/ANT/COLPO  . COLONOSCOPY  07/16/2010  . EXTERNAL EAR SURGERY    . INNER EAR SURGERY     IMPLANT  . TONSILLECTOMY AND ADENOIDECTOMY    . TUBAL LIGATION     Family History:  Family History   Problem Relation Age of Onset  . Diabetes Mother   . Hypertension Mother   . Heart disease Mother   . Fibromyalgia Mother   . Congestive Heart Failure Mother   . Hypertension Father   . Congestive Heart Failure Father   . Hypertension Sister   . COPD Sister   . OCD Sister   . Fibromyalgia Sister   . Bipolar disorder Sister   . Bipolar disorder Brother   . Bipolar disorder Sister   . Bipolar disorder Maternal Grandfather    Family Psychiatric  History: See H&P Social History:  History  Alcohol Use  . 0.0 oz/week    Comment: SELDOM     History  Drug Use No    Social History   Social History  . Marital status: Widowed    Spouse name: N/A  . Number of children: N/A  . Years of education: N/A   Social History Main Topics  . Smoking status: Never Smoker  . Smokeless tobacco: Never Used  . Alcohol use 0.0 oz/week     Comment: SELDOM  . Drug use: No  . Sexual activity: Not Currently    Birth control/ protection: Surgical     Comment: HYST   Other Topics Concern  . None   Social History Narrative  . None   Hospital Course: Carrie Macias is a 61 y old CF, who is widowed , unemployed , lives with her sister in [place] , was IVCed by sister and brought to [facility]  initially by [facility] for worsening depression , S/P suicide attempts x 2 by OD on pills as well as by attempting to cut her wrist. Patient seen and chart reviewed.Discussed patient with treatment team. Pt today seen as calm , cooperative . She reports feeling depressed and anxious , but she has been trying to cope . She is motivated to get help and make a change in her life. She reports that her husband of 26 years passed away from a cardiac arrest in 2009-12-26, her son who was 62 y old died in a car wreck in 12/27/87, her daughter who was 70 yrs old passed away from the flu in December 27, 2010. She has sisters who are supportive and lives with one of them. She reports that she also went through a thyroid cancer also had recent medical admission for sepsis.  She reports that she felt more and more depressed, had anhedonia and loss of appetite and poor sleep and she felt like life has no meaning. She reports that she tried to kill herself by OD on [place] 2 months ago and also recently tried to cut her wrist with a knife. She reports that she had stopped taking all her medications, but however she feels like it is is a mistake and has started taking them again. She reports that she was on lexapro and remeron, however was noncompliant. She was supposed to go to her first appointment at [facility] today, but was unable to do so since she got admitted here. She reports that she does have some anxiety sx, worries a lot, but nothing extreme. She denies hx of abuse, denies mania/hypomanic sx.   After evaluation of her presenting symptoms, Carrie Macias was started on medication regimen for her presenting symptoms. She was medicated & discharged on; Duloxetine 60 mg for depression & Mirtazapine 30 mg for depression/insomnia. She was also resumed on her pertinent home medications for her other pre-existing medical issues presented. Carrie Macias was enrolled & participated in the group counseling sessions being offered & held on this unit. She learned coping skills.  Carrie Macias is seen today. States that she feels better. She has been able to process stressors. Says she has come to terms with what happened to her over the years. Says her spirits has lifted. She regrets attempting to end her life. No current thoughts of suicide. Wants to to go home to continue mental health treatment on an outpatient basis as noted below. Reports good support from her  sisters. Her sister will be picking her up. Says she is able to focus. She is able to think clearly. She is now sleeping well at night. She is eating normally. No evidence of mania. No evidence of psychosis.  Past history of suicidal attempts. No family history of suicide. No substance use.  Nursing staff reports that patient has been  appropriate on the unit. Patient has been interacting well with peers. No behavioral issues. Patient has not voiced any suicidal thoughts. Patient has not been observed to be internally stimulated. Patient has been adherent with treatment recommendations. Patient has been tolerating their medication well.   Patient was discussed at team. Team members feels that patient is back to herbaseline level of function. Team agrees with plan to discharge patient today. Carrie Macias is being discharged to continue mental health care on an outpatient basis as noted below. She left [facility] with all personal belongings in no apparent distress. Transportation per mother.  Physical Findings: AIMS: Facial and Oral Movements Muscles of Facial Expression:  None, normal Lips and Perioral Area: None, normal Jaw: None, normal Tongue: None, normal,Extremity Movements Upper (arms, wrists, hands, fingers): None, normal Lower (legs, knees, ankles, toes): None, normal, Trunk Movements Neck, shoulders, hips: None, normal, Overall Severity Severity of abnormal movements (highest score from questions above): None, normal Incapacitation due to abnormal movements: None, normal Patient's awareness of abnormal movements (rate only patient's report): No Awareness, Dental Status Current problems with teeth and/or dentures?: No Does patient usually wear dentures?: No  CIWA:  CIWA-Ar Total: 1 COWS:  COWS Total Score: 1  Musculoskeletal: Strength & Muscle Tone: within normal limits Gait & Station: normal Patient leans: N/A  Psychiatric Specialty Exam: Physical Exam  Constitutional: She is oriented to person, place, and time. She appears well-developed.  HENT:  Head: Normocephalic.  Eyes: Pupils are equal, round, and reactive to light.  Neck: Normal range of motion.  Cardiovascular: Normal rate.   Hx. HTN  Respiratory: Effort normal.  GI: Soft.  Genitourinary:  Genitourinary Comments: Deferred  Musculoskeletal: Normal range of  motion.  Neurological: She is alert and oriented to person, place, and time.  Skin: Skin is warm and dry.    Review of Systems  Constitutional: Negative.   HENT: Negative.   Eyes: Negative.   Respiratory: Negative.   Cardiovascular: Negative.   Gastrointestinal: Negative.   Genitourinary: Negative.   Musculoskeletal: Negative.   Skin: Negative.   Neurological: Negative.   Endo/Heme/Allergies: Negative.   Psychiatric/Behavioral: Positive for depression (Stable). Negative for hallucinations, memory loss, substance abuse and suicidal ideas. The patient has insomnia (Stable). The patient is not nervous/anxious.     Blood pressure 139/71, pulse 68, temperature 98.1 F (36.7 C), temperature source Oral, resp. rate 20, height 5\' 5"  (1.651 m), weight 51.7 kg (114 lb).Body mass index is 18.97 kg/m.  See Md's SRA   Have you used any form of tobacco in the last 30 days? (Cigarettes, Smokeless Tobacco, Cigars, and/or Pipes): No  Has this patient used any form of tobacco in the last 30 days? (Cigarettes, Smokeless Tobacco, Cigars, and/or Pipes): No  Blood Alcohol level:  Lab Results  Component Value Date   ETH <5 01/11/2017   ETH <5 16/06/9603    Metabolic Disorder Labs:  Lab Results  Component Value Date   HGBA1C 6.1 (H) 12/27/2016   MPG 128 12/27/2016   No results found for: PROLACTIN No results found for: CHOL, TRIG, HDL, CHOLHDL, VLDL, LDLCALC  See Psychiatric Specialty Exam and Suicide Risk Assessment completed by Attending Physician prior to discharge.  Discharge destination:  Home  Is patient on multiple antipsychotic therapies at discharge:  No   Has Patient had three or more failed trials of antipsychotic monotherapy by history:  No  Recommended Plan for Multiple Antipsychotic Therapies: NA  Allergies as of 01/17/2017      Reactions   Aspirin Nausea Only   Sick to stomach   Ibuprofen Nausea Only   Sick to stomach      Medication List    STOP taking these  medications   escitalopram 5 MG tablet Commonly known as:  LEXAPRO   VITAMIN C PO     TAKE these medications     Indication  calcium carbonate 600 MG Tabs tablet Commonly known as:  OS-CAL Take 1 tablet (600 mg total) by mouth 2 (two) times daily with a meal. For Bone health What changed:  additional instructions  Indication:  Low Amount of Calcium in the Blood   cholecalciferol 1000 units tablet Commonly known  as:  VITAMIN D Take 1 tablet (1,000 Units total) by mouth daily. For Bone health What changed:  additional instructions  Indication:  Bone health   DULoxetine 60 MG capsule Commonly known as:  CYMBALTA Take 1 capsule (60 mg total) by mouth daily. For depression Start taking on:  01/18/2017  Indication:  Major Depressive Disorder   levothyroxine 88 MCG tablet Commonly known as:  SYNTHROID, LEVOTHROID Take 1 tablet (88 mcg total) by mouth daily. For thyroid hormone replacement What changed:  additional instructions  Indication:  Underactive Thyroid   losartan 100 MG tablet Commonly known as:  COZAAR Take 1 tablet (100 mg total) by mouth daily. For high blood pressure What changed:  additional instructions  Indication:  High Blood Pressure Disorder   metFORMIN 500 MG 24 hr tablet Commonly known as:  GLUCOPHAGE-XR Take 1 tablet (500 mg total) by mouth daily. For diabetes management What changed:  additional instructions  Indication:  Type 2 Diabetes   metoprolol succinate 25 MG 24 hr tablet Commonly known as:  TOPROL-XL Take 1 tablet (25 mg total) by mouth daily. For high blood pressure What changed:  additional instructions  Indication:  High Blood Pressure Disorder   mirtazapine 30 MG tablet Commonly known as:  REMERON Take 1 tablet (30 mg total) by mouth at bedtime. For depression/sleep What changed:  medication strength  how much to take  additional instructions  Indication:  Major Depressive Disorder   multivitamin tablet Take 1 tablet by mouth  daily. For Vitamin supplement What changed:  additional instructions  Indication:  Vitamin supplement   saccharomyces boulardii 250 MG capsule Commonly known as:  FLORASTOR Prevention of diarrhea What changed:  how much to take  how to take this  when to take this  additional instructions  Indication:  Prevention of diarrhea.   simvastatin 80 MG tablet Commonly known as:  ZOCOR Take 1 tablet (80 mg total) by mouth at bedtime. For high Cholesterol What changed:  additional instructions  Indication:  Inherited Heterozygous Hypercholesterolemia   traMADol 50 MG tablet Commonly known as:  ULTRAM Take 1 tablet (50 mg total) by mouth every 6 (six) hours as needed (for pain).  Indication:  Moderate to Moderately Severe Pain      Follow-up Information    [facility] Follow up.   Specialty:  Behavioral Health Why:  Therapy appointment 01/26/17 @1PM  with Charolotte Eke.  Contact information: [place] [phone]         Follow-up recommendations: Activity:  As tolerated Diet: As recommended by your primary care doctor. Keep all scheduled follow-up appointments as recommended.    Comments: Patient is instructed prior to discharge to: Take all medications as prescribed by his/her mental healthcare provider. Report any adverse effects and or reactions from the medicines to his/her outpatient provider promptly. Patient has been instructed & cautioned: To not engage in alcohol and or illegal drug use while on prescription medicines. In the event of worsening symptoms, patient is instructed to call the crisis hotline, 911 and or go to the nearest ED for appropriate evaluation and treatment of symptoms. To follow-up with his/her primary care provider for your other medical issues, concerns and or health care needs.   Signed: Encarnacion Slates, NP, PMHNP, FNP-BC 01/17/2017, 10:13 AM

## 2017-01-17 NOTE — Progress Notes (Signed)
D: Pt at the time of assessment was alert and oriented x4. Pt at the time denied depression, anxiety, AVH, SI or HI; states, "I think my medications have kicked in; I think I'm ready to go." Pt was flat, isolative and withdrawn to self even while in the dayroom. Pt complained about generalized body pain; "all I have is some old age pains that I'm used to." Pt remained calm and cooperative  A: Medications offered as prescribed. All patient's questions and concerns addressed. Support, encouragement, and safe environment provided. Will continue to monitor for any changes. 15-minute safety checks continue. R: Pt was med compliant. Pt did attend wrap-up group. Safety checks continue.

## 2017-01-17 NOTE — Tx Team (Signed)
Interdisciplinary Treatment and Diagnostic Plan Update  01/17/2017 Time of Session: 12:14 PM  Carrie Macias MRN: [id number]  Principal Diagnosis: MDD (major depressive disorder), recurrent severe, without psychosis ([place])  Secondary Diagnoses: Principal Problem:   MDD (major depressive disorder), recurrent severe, without psychosis ([place])   Current Medications:  Current Facility-Administered Medications  Medication Dose Route Frequency Provider Last Rate Last Dose  . acetaminophen (TYLENOL) tablet 650 mg  650 mg Oral Q6H PRN Laverle Hobby, PA-C      . alum & mag hydroxide-simeth (MAALOX/MYLANTA) 200-200-20 MG/5ML suspension 30 mL  30 mL Oral Q4H PRN Laverle Hobby, PA-C      . atorvastatin (LIPITOR) tablet 40 mg  40 mg Oral q1800 Laverle Hobby, PA-C   40 mg at 01/16/17 1700  . calcium carbonate (OS-CAL - dosed in mg of elemental calcium) tablet 1,250 mg  1,250 mg Oral BID WC Laverle Hobby, PA-C   1,250 mg at 01/17/17 0829  . cholecalciferol (VITAMIN D) tablet 1,000 Units  1,000 Units Oral Daily Laverle Hobby, PA-C   1,000 Units at 01/17/17 0258  . DULoxetine (CYMBALTA) DR capsule 60 mg  60 mg Oral Daily Artist Beach, MD   60 mg at 01/17/17 0829  . feeding supplement (GLUCERNA SHAKE) (GLUCERNA SHAKE) liquid 237 mL  237 mL Oral TID BM Kerrie Buffalo, NP   Stopped at 01/12/17 1120  . levothyroxine (SYNTHROID, LEVOTHROID) tablet 88 mcg  88 mcg Oral QAC breakfast Laverle Hobby, PA-C   88 mcg at 01/17/17 5277  . losartan (COZAAR) tablet 100 mg  100 mg Oral Daily Laverle Hobby, PA-C   100 mg at 01/17/17 8242  . magnesium hydroxide (MILK OF MAGNESIA) suspension 30 mL  30 mL Oral Daily PRN Laverle Hobby, PA-C   30 mL at 01/12/17 1307  . metFORMIN (GLUCOPHAGE-XR) 24 hr tablet 500 mg  500 mg Oral Q breakfast Laverle Hobby, PA-C   500 mg at 01/17/17 0830  . metoprolol succinate (TOPROL-XL) 24 hr tablet 25 mg  25 mg Oral Daily Laverle Hobby, PA-C   25 mg at 01/17/17 0830  .  mirtazapine (REMERON) tablet 30 mg  30 mg Oral QHS Artist Beach, MD   30 mg at 01/16/17 2139  . multivitamin with minerals tablet 1 tablet  1 tablet Oral Daily Laverle Hobby, PA-C   1 tablet at 01/17/17 0830  . saccharomyces boulardii (FLORASTOR) capsule 250 mg  250 mg Oral BID Laverle Hobby, PA-C   250 mg at 01/17/17 0830  . traMADol (ULTRAM) tablet 50 mg  50 mg Oral Q6H PRN Laverle Hobby, PA-C        PTA Medications: Prescriptions Prior to Admission  Medication Sig Dispense Refill Last Dose  . Ascorbic Acid (VITAMIN C PO) Take 1 tablet by mouth daily.   01/10/2017 at Unknown time  . escitalopram (LEXAPRO) 5 MG tablet Take 1 tablet (5 mg total) by mouth daily. 30 tablet 0 Past Week at Unknown time  . mirtazapine (REMERON) 7.5 MG tablet Take 1 tablet (7.5 mg total) by mouth at bedtime. 30 tablet 0 01/10/2017 at Unknown time  . [DISCONTINUED] calcium carbonate (OS-CAL) 600 MG TABS Take 600 mg by mouth 2 (two) times daily with a meal.   01/10/2017 at Unknown time  . [DISCONTINUED] cholecalciferol (VITAMIN D) 1000 UNITS tablet Take 1,000 Units by mouth daily.   01/10/2017 at Unknown time  . [DISCONTINUED] levothyroxine (SYNTHROID, LEVOTHROID) 88 MCG  tablet Take 88 mcg by mouth daily.    01/10/2017 at Unknown time  . [DISCONTINUED] losartan (COZAAR) 100 MG tablet Take 100 mg by mouth daily.    01/10/2017 at Unknown time  . [DISCONTINUED] metFORMIN (GLUCOPHAGE-XR) 500 MG 24 hr tablet Take 500 mg by mouth daily.   3 01/10/2017 at Unknown time  . [DISCONTINUED] metoprolol succinate (TOPROL-XL) 25 MG 24 hr tablet Take 1 tablet (25 mg total) by mouth daily. 30 tablet 0 01/10/2017 at Unknown time  . [DISCONTINUED] Multiple Vitamin (MULTIVITAMIN) tablet Take 1 tablet by mouth daily.   01/10/2017 at Unknown time  . [DISCONTINUED] saccharomyces boulardii (FLORASTOR) 250 MG capsule Take 1 capsule (250 mg total) by mouth 2 (two) times daily. 20 capsule 0 Past Week at Unknown time  . [DISCONTINUED]  simvastatin (ZOCOR) 80 MG tablet Take 80 mg by mouth at bedtime.   01/10/2017 at Unknown time  . [DISCONTINUED] traMADol (ULTRAM) 50 MG tablet Take 1 tablet (50 mg total) by mouth every 6 (six) hours as needed (for pain). 20 tablet 0 unknown    Treatment Modalities: Medication Management, Group therapy, Case management,  1 to 1 session with clinician, Psychoeducation, Recreational therapy.   Physician Treatment Plan for Primary Diagnosis: MDD (major depressive disorder), recurrent severe, without psychosis ([place]) Long Term Goal(s): Improvement in symptoms so as ready for discharge  Short Term Goals: Ability to identify changes in lifestyle to reduce recurrence of condition will improve Ability to verbalize feelings will improve Ability to disclose and discuss suicidal ideas Compliance with prescribed medications will improve Ability to identify changes in lifestyle to reduce recurrence of condition will improve Ability to verbalize feelings will improve Ability to disclose and discuss suicidal ideas Compliance with prescribed medications will improve  Medication Management: Evaluate patient's response, side effects, and tolerance of medication regimen.  Therapeutic Interventions: 1 to 1 sessions, Unit Group sessions and Medication administration.  Evaluation of Outcomes: Adequate for Discharge  Physician Treatment Plan for Secondary Diagnosis: Principal Problem:   MDD (major depressive disorder), recurrent severe, without psychosis ([place])   Long Term Goal(s): Improvement in symptoms so as ready for discharge  Short Term Goals: Ability to identify changes in lifestyle to reduce recurrence of condition will improve Ability to verbalize feelings will improve Ability to disclose and discuss suicidal ideas Compliance with prescribed medications will improve Ability to identify changes in lifestyle to reduce recurrence of condition will improve Ability to verbalize feelings will  improve Ability to disclose and discuss suicidal ideas Compliance with prescribed medications will improve  Medication Management: Evaluate patient's response, side effects, and tolerance of medication regimen.  Therapeutic Interventions: 1 to 1 sessions, Unit Group sessions and Medication administration.  Evaluation of Outcomes: Adequate for Discharge   RN Treatment Plan for Primary Diagnosis: MDD (major depressive disorder), recurrent severe, without psychosis ([place]) Long Term Goal(s): Knowledge of disease and therapeutic regimen to maintain health will improve  Short Term Goals: Ability to disclose and discuss suicidal ideas and Ability to identify and develop effective coping behaviors will improve  Medication Management: RN will administer medications as ordered by provider, will assess and evaluate patient's response and provide education to patient for prescribed medication. RN will report any adverse and/or side effects to prescribing provider.  Therapeutic Interventions: 1 on 1 counseling sessions, Psychoeducation, Medication administration, Evaluate responses to treatment, Monitor vital signs and CBGs as ordered, Perform/monitor CIWA, COWS, AIMS and Fall Risk screenings as ordered, Perform wound care treatments as ordered.  Evaluation of Outcomes: Adequate  for Discharge   LCSW Treatment Plan for Primary Diagnosis: MDD (major depressive disorder), recurrent severe, without psychosis ([place]) Long Term Goal(s): Safe transition to appropriate next level of care at discharge, Engage patient in therapeutic group addressing interpersonal concerns.  Short Term Goals: Engage patient in aftercare planning with referrals and resources  Therapeutic Interventions: Assess for all discharge needs, 1 to 1 time with Social worker, Explore available resources and support systems, Assess for adequacy in community support network, Educate family and significant other(s) on suicide prevention, Complete  Psychosocial Assessment, Interpersonal group therapy.  Evaluation of Outcomes: Met  Return home, follow up [facility] out[facility]   Progress in Treatment: Attending groups: Yes Participating in groups: Yes Taking medication as prescribed: Yes Toleration medication: Yes, no side effects reported at this time Family/Significant other contact made: Yes Patient understands diagnosis: Yes AEB asking for help with depression Discussing patient identified problems/goals with staff: Yes Medical problems stabilized or resolved: Yes Denies suicidal/homicidal ideation: Yes Issues/concerns per patient self-inventory: None Other: N/A  New problem(s) identified: None identified at this time.   New Short Term/Long Term Goal(s): None identified at this time.   Discharge Plan or Barriers:   Reason for Continuation of Hospitalization:    Estimated Length of Stay:D/C today  Attendees: Patient: 01/17/2017  12:14 PM  Physician: Ursula Alert, MD 01/17/2017  12:14 PM  Nursing: Grayland Ormond RN 01/17/2017  12:14 PM  RN Care Manager: Lars Pinks, RN 01/17/2017  12:14 PM  Social Worker: Ripley Fraise 01/17/2017  12:14 PM  Recreational Therapist: Laretta Bolster  01/17/2017  12:14 PM  Other: Norberto Sorenson 01/17/2017  12:14 PM  Other:  01/17/2017  12:14 PM    Scribe for Treatment Team:  Roque Lias LCSW 01/17/2017 12:14 PM

## 2017-01-17 NOTE — [facility] Suicide Risk Assessment (Signed)
[facility] Discharge Suicide Risk Assessment   Principal Problem: MDD (major depressive disorder), recurrent severe, without psychosis ([place]) Discharge Diagnoses:  Patient Active Problem List   Diagnosis Date Noted  . MDD (major depressive disorder), recurrent severe, without psychosis ([place]) [F33.2] 01/12/2017  . Sepsis ([place]) [A41.9] 12/27/2016  . Dysthymic disorder [F34.1] 12/27/2016  . Protein-calorie malnutrition, severe [E43] 12/27/2016  . Hypertension [I10]   . Thyroid disease [E07.9]   . SVT (supraventricular tachycardia) ([place]) [I47.1]   . Depression with suicidal ideation [F32.9, R45.851] 12/13/2016  . Female cystocele [N81.10] 02/07/2014  . History of vitamin D deficiency [Z86.39] 01/27/2012  . Hypercholesterolemia [E78.00]   . Back pain, chronic [M54.9, G89.29]     Total Time spent with patient: 30 minutes  Musculoskeletal: Strength & Muscle Tone: within normal limits Gait & Station: normal Patient leans: N/A  Psychiatric Specialty Exam: Review of Systems  Psychiatric/Behavioral: Negative for depression, hallucinations and suicidal ideas.  All other systems reviewed and are negative.   Blood pressure 139/71, pulse 68, temperature 98.1 F (36.7 C), temperature source Oral, resp. rate 20, height 5\' 5"  (1.651 m), weight 51.7 kg (114 lb).Body mass index is 18.97 kg/m.  General Appearance: Casual  Eye Contact::  Fair  Speech:  Clear and Coherent409  Volume:  Normal  Mood:  Euthymic  Affect:  Congruent  Thought Process:  Goal Directed and Descriptions of Associations: Intact  Orientation:  Full (Time, Place, and Person)  Thought Content:  Logical  Suicidal Thoughts:  No  Homicidal Thoughts:  No  Memory:  Immediate;   Fair Recent;   Fair Remote;   Fair  Judgement:  Fair  Insight:  Fair  Psychomotor Activity:  Normal  Concentration:  Fair  Recall:  [employer] of Knowledge:Fair  Language: Fair  Akathisia:  No  Handed:  Right  AIMS (if indicated):     Assets:   Communication Skills Desire for Improvement  Sleep:  Number of Hours: 6.75  Cognition: WNL  ADL's:  Intact   Mental Status Per Nursing Assessment::   On Admission:  Self-harm thoughts  Demographic Factors:  Caucasian  Loss Factors: NA  Historical Factors: Impulsivity  Risk Reduction Factors:   Positive social support and Positive therapeutic relationship  Continued Clinical Symptoms:  Previous Psychiatric Diagnoses and Treatments  Cognitive Features That Contribute To Risk:  None    Suicide Risk:  Minimal: No identifiable suicidal ideation.  Patients presenting with no risk factors but with morbid ruminations; may be classified as minimal risk based on the severity of the depressive symptoms  Follow-up [facility] Follow up.   Specialty:  Behavioral Health Why:  Therapy appointment 01/26/17 @1PM  with Charolotte Eke.  Contact information: [address] [phone]          Plan Of Care/Follow-up recommendations:  Activity:  NO RESTRICTIONS Diet:  CARB MODIFIED DIET Tests:  AS NEEDED Other:  FOLLOW UP WITH Aretta Nip, MD 01/17/2017, 9:32 AM

## 2017-01-17 NOTE — Progress Notes (Addendum)
Discharge Note:  Patient discharged home with family member.  Patient denied SI and HI.  Denied A/V hallucinations.  Patient stated she received all her belongings.  Suicide prevention information given and discussed with patient who stated she understood and had no questions.  Patient stated she appreciated all assistance received from [facility] staff.  All required discharge information given to patient at discharge.

## 2017-01-17 NOTE — Progress Notes (Signed)
Recreation Therapy Notes  Date: 01/17/17 Time: 1000 Location: [address] Dayroom  Group Topic: Coping Skills  Goal Area(s) Addresses:  Patients will be able to identify positive coping skills. Patients will be able to identify benefits of using copings skills post d/c.  Behavioral Response: Engaged  Intervention: Pencils, blank mindmap  Activity: Mindmap.  Patients were given a blank mindmap.  LRT and patients filled in the first eight boxes together.  Patients were to then come up with three coping skills for each trigger named. Once patients came up with their coping skills, LRT would fill in the coping skills on the board.   Education: Radiographer, therapeutic, Dentist.   Education Outcome: Acknowledges understanding/In group clarification offered/Needs additional education.   Clinical Observations/Feedback: Pt stated her coping skills will help her deal with day to day life.  Pt expressed medication, support groups, exercise and not smoking were her coping skills.   Victorino Sparrow, LRT/CTRS         Victorino Sparrow A 01/17/2017 12:53 PM

## 2017-01-17 NOTE — Progress Notes (Signed)
D:  Patient's self inventory sheet, patient has fair sleep, sleep medication administered.  Good appetite, normal energy level, good concentration.  Rated depression, hopeless and anxiety #1.  Denied withdrawals.  Denied SI.  Denied physical problems.  Denied pain.  Goal is discharge.  Plans to eat and take medications.  Does have discharge plans. A:  Medications administered per MD orders.  Emotional support and encouragement given patient. R:  Denied SI and HI, contracts for safety.  Denied A/V hallucinations.  Safety maintained with 15 minute checks.

## 2017-01-26 ENCOUNTER — Encounter (INDEPENDENT_AMBULATORY_CARE_PROVIDER_SITE_OTHER): Payer: Self-pay

## 2017-01-26 ENCOUNTER — Encounter (HOSPITAL_COMMUNITY): Payer: Self-pay | Admitting: Licensed Clinical Social Worker

## 2017-01-26 ENCOUNTER — Ambulatory Visit (INDEPENDENT_AMBULATORY_CARE_PROVIDER_SITE_OTHER): Payer: BLUE CROSS/BLUE SHIELD | Admitting: Licensed Clinical Social Worker

## 2017-01-26 DIAGNOSIS — F332 Major depressive disorder, recurrent severe without psychotic features: Secondary | ICD-10-CM

## 2017-01-26 NOTE — Progress Notes (Signed)
Patient ID: Carrie Macias, female   DOB: 1956/03/29, 61 y.o.   MRN: [id number]  Pt came in for CCA accompanied with her sister. She was referred by [facility]. Pt was under the impression since she was IVC'd to [place] by her sisters that she had to come for OP therapy. Her sister wants to take her sister back to the  [place] area to live and assist her in getting healthy. Pt has many physical issues as well. Pt does not want therapy. Pt's sister asked about what medications pt is supposed to be taking. Looked at DC from [facility] (sister also had a copy of the d/c from [facility]. Gave them my card and told them to call if pt decides she wants OP therapy.  Jenkins Rouge, LCAS

## 2017-01-28 ENCOUNTER — Encounter: Payer: BLUE CROSS/BLUE SHIELD | Admitting: Cardiology

## 2017-01-31 DIAGNOSIS — F419 Anxiety disorder, unspecified: Secondary | ICD-10-CM | POA: Diagnosis not present

## 2017-01-31 DIAGNOSIS — Z79899 Other long term (current) drug therapy: Secondary | ICD-10-CM | POA: Diagnosis not present

## 2017-01-31 DIAGNOSIS — R45851 Suicidal ideations: Secondary | ICD-10-CM | POA: Diagnosis not present

## 2017-01-31 DIAGNOSIS — Z9114 Patient's other noncompliance with medication regimen: Secondary | ICD-10-CM | POA: Diagnosis not present

## 2017-01-31 DIAGNOSIS — I10 Essential (primary) hypertension: Secondary | ICD-10-CM | POA: Diagnosis not present

## 2017-01-31 DIAGNOSIS — F332 Major depressive disorder, recurrent severe without psychotic features: Secondary | ICD-10-CM | POA: Diagnosis not present

## 2017-01-31 DIAGNOSIS — F329 Major depressive disorder, single episode, unspecified: Secondary | ICD-10-CM | POA: Diagnosis not present

## 2017-01-31 DIAGNOSIS — K219 Gastro-esophageal reflux disease without esophagitis: Secondary | ICD-10-CM | POA: Diagnosis not present

## 2017-01-31 DIAGNOSIS — E785 Hyperlipidemia, unspecified: Secondary | ICD-10-CM | POA: Diagnosis not present

## 2017-01-31 DIAGNOSIS — Z7984 Long term (current) use of oral hypoglycemic drugs: Secondary | ICD-10-CM | POA: Diagnosis not present

## 2017-01-31 DIAGNOSIS — E119 Type 2 diabetes mellitus without complications: Secondary | ICD-10-CM | POA: Diagnosis not present

## 2017-01-31 DIAGNOSIS — Z915 Personal history of self-harm: Secondary | ICD-10-CM | POA: Diagnosis not present

## 2017-01-31 DIAGNOSIS — F341 Dysthymic disorder: Secondary | ICD-10-CM | POA: Diagnosis not present

## 2017-02-01 DIAGNOSIS — Z79899 Other long term (current) drug therapy: Secondary | ICD-10-CM | POA: Diagnosis not present

## 2017-02-01 DIAGNOSIS — R1084 Generalized abdominal pain: Secondary | ICD-10-CM | POA: Diagnosis not present

## 2017-02-01 DIAGNOSIS — F332 Major depressive disorder, recurrent severe without psychotic features: Secondary | ICD-10-CM | POA: Diagnosis not present

## 2017-02-01 DIAGNOSIS — F322 Major depressive disorder, single episode, severe without psychotic features: Secondary | ICD-10-CM | POA: Diagnosis not present

## 2017-02-01 DIAGNOSIS — Z7984 Long term (current) use of oral hypoglycemic drugs: Secondary | ICD-10-CM | POA: Diagnosis not present

## 2017-02-01 DIAGNOSIS — K5900 Constipation, unspecified: Secondary | ICD-10-CM | POA: Diagnosis not present

## 2017-02-01 DIAGNOSIS — F329 Major depressive disorder, single episode, unspecified: Secondary | ICD-10-CM | POA: Diagnosis not present

## 2017-02-01 DIAGNOSIS — Z915 Personal history of self-harm: Secondary | ICD-10-CM | POA: Diagnosis not present

## 2017-02-01 DIAGNOSIS — Z85850 Personal history of malignant neoplasm of thyroid: Secondary | ICD-10-CM | POA: Diagnosis not present

## 2017-02-01 DIAGNOSIS — R45851 Suicidal ideations: Secondary | ICD-10-CM | POA: Diagnosis not present

## 2017-02-01 DIAGNOSIS — E7800 Pure hypercholesterolemia, unspecified: Secondary | ICD-10-CM | POA: Diagnosis not present

## 2017-02-01 DIAGNOSIS — K219 Gastro-esophageal reflux disease without esophagitis: Secondary | ICD-10-CM | POA: Diagnosis not present

## 2017-02-01 DIAGNOSIS — E119 Type 2 diabetes mellitus without complications: Secondary | ICD-10-CM | POA: Diagnosis not present

## 2017-02-01 DIAGNOSIS — I10 Essential (primary) hypertension: Secondary | ICD-10-CM | POA: Diagnosis not present

## 2017-02-01 DIAGNOSIS — E785 Hyperlipidemia, unspecified: Secondary | ICD-10-CM | POA: Diagnosis not present

## 2017-02-01 DIAGNOSIS — Z9114 Patient's other noncompliance with medication regimen: Secondary | ICD-10-CM | POA: Diagnosis not present

## 2017-02-01 DIAGNOSIS — F341 Dysthymic disorder: Secondary | ICD-10-CM | POA: Diagnosis not present

## 2017-02-01 DIAGNOSIS — F419 Anxiety disorder, unspecified: Secondary | ICD-10-CM | POA: Diagnosis not present

## 2017-02-01 DIAGNOSIS — E039 Hypothyroidism, unspecified: Secondary | ICD-10-CM | POA: Diagnosis not present

## 2017-02-02 ENCOUNTER — Encounter: Payer: Self-pay | Admitting: Gynecology

## 2017-02-02 DIAGNOSIS — K5900 Constipation, unspecified: Secondary | ICD-10-CM | POA: Diagnosis not present

## 2017-02-02 DIAGNOSIS — F332 Major depressive disorder, recurrent severe without psychotic features: Secondary | ICD-10-CM | POA: Diagnosis not present

## 2017-02-02 DIAGNOSIS — E119 Type 2 diabetes mellitus without complications: Secondary | ICD-10-CM | POA: Diagnosis not present

## 2017-02-02 DIAGNOSIS — E039 Hypothyroidism, unspecified: Secondary | ICD-10-CM | POA: Diagnosis not present

## 2017-02-02 DIAGNOSIS — I10 Essential (primary) hypertension: Secondary | ICD-10-CM | POA: Diagnosis not present

## 2017-02-03 DIAGNOSIS — F332 Major depressive disorder, recurrent severe without psychotic features: Secondary | ICD-10-CM | POA: Diagnosis not present

## 2017-02-04 DIAGNOSIS — F332 Major depressive disorder, recurrent severe without psychotic features: Secondary | ICD-10-CM | POA: Diagnosis not present

## 2017-02-05 DIAGNOSIS — F332 Major depressive disorder, recurrent severe without psychotic features: Secondary | ICD-10-CM | POA: Diagnosis not present

## 2017-02-06 DIAGNOSIS — F332 Major depressive disorder, recurrent severe without psychotic features: Secondary | ICD-10-CM | POA: Diagnosis not present

## 2017-02-07 DIAGNOSIS — F332 Major depressive disorder, recurrent severe without psychotic features: Secondary | ICD-10-CM | POA: Diagnosis not present

## 2017-02-08 DIAGNOSIS — F332 Major depressive disorder, recurrent severe without psychotic features: Secondary | ICD-10-CM | POA: Diagnosis not present

## 2017-02-09 DIAGNOSIS — F332 Major depressive disorder, recurrent severe without psychotic features: Secondary | ICD-10-CM | POA: Diagnosis not present

## 2017-02-10 DIAGNOSIS — F332 Major depressive disorder, recurrent severe without psychotic features: Secondary | ICD-10-CM | POA: Diagnosis not present

## 2017-02-11 DIAGNOSIS — F332 Major depressive disorder, recurrent severe without psychotic features: Secondary | ICD-10-CM | POA: Diagnosis not present

## 2017-02-12 DIAGNOSIS — K5900 Constipation, unspecified: Secondary | ICD-10-CM | POA: Diagnosis not present

## 2017-02-12 DIAGNOSIS — F332 Major depressive disorder, recurrent severe without psychotic features: Secondary | ICD-10-CM | POA: Diagnosis not present

## 2017-02-12 DIAGNOSIS — Z9114 Patient's other noncompliance with medication regimen: Secondary | ICD-10-CM | POA: Diagnosis not present

## 2017-02-12 DIAGNOSIS — R1084 Generalized abdominal pain: Secondary | ICD-10-CM | POA: Diagnosis not present

## 2017-02-13 DIAGNOSIS — F332 Major depressive disorder, recurrent severe without psychotic features: Secondary | ICD-10-CM | POA: Diagnosis not present

## 2017-02-13 DIAGNOSIS — Z9114 Patient's other noncompliance with medication regimen: Secondary | ICD-10-CM | POA: Diagnosis not present

## 2017-02-14 DIAGNOSIS — F332 Major depressive disorder, recurrent severe without psychotic features: Secondary | ICD-10-CM | POA: Diagnosis not present

## 2017-02-15 DIAGNOSIS — F329 Major depressive disorder, single episode, unspecified: Secondary | ICD-10-CM | POA: Diagnosis not present

## 2017-02-15 DIAGNOSIS — Z818 Family history of other mental and behavioral disorders: Secondary | ICD-10-CM | POA: Diagnosis not present

## 2017-02-15 DIAGNOSIS — I444 Left anterior fascicular block: Secondary | ICD-10-CM | POA: Diagnosis not present

## 2017-02-15 DIAGNOSIS — F332 Major depressive disorder, recurrent severe without psychotic features: Secondary | ICD-10-CM | POA: Diagnosis not present

## 2017-02-15 DIAGNOSIS — R9431 Abnormal electrocardiogram [ECG] [EKG]: Secondary | ICD-10-CM | POA: Diagnosis not present

## 2017-02-15 DIAGNOSIS — E039 Hypothyroidism, unspecified: Secondary | ICD-10-CM | POA: Diagnosis not present

## 2017-02-15 DIAGNOSIS — I10 Essential (primary) hypertension: Secondary | ICD-10-CM | POA: Diagnosis not present

## 2017-02-15 DIAGNOSIS — R0602 Shortness of breath: Secondary | ICD-10-CM | POA: Diagnosis not present

## 2017-02-15 DIAGNOSIS — Z452 Encounter for adjustment and management of vascular access device: Secondary | ICD-10-CM | POA: Diagnosis not present

## 2017-02-15 DIAGNOSIS — K219 Gastro-esophageal reflux disease without esophagitis: Secondary | ICD-10-CM | POA: Diagnosis not present

## 2017-02-16 DIAGNOSIS — I444 Left anterior fascicular block: Secondary | ICD-10-CM | POA: Diagnosis not present

## 2017-02-16 DIAGNOSIS — E039 Hypothyroidism, unspecified: Secondary | ICD-10-CM | POA: Diagnosis not present

## 2017-02-16 DIAGNOSIS — I10 Essential (primary) hypertension: Secondary | ICD-10-CM | POA: Diagnosis not present

## 2017-02-16 DIAGNOSIS — R0602 Shortness of breath: Secondary | ICD-10-CM | POA: Diagnosis not present

## 2017-02-16 DIAGNOSIS — F332 Major depressive disorder, recurrent severe without psychotic features: Secondary | ICD-10-CM | POA: Diagnosis not present

## 2017-02-16 DIAGNOSIS — R9431 Abnormal electrocardiogram [ECG] [EKG]: Secondary | ICD-10-CM | POA: Diagnosis not present

## 2017-02-16 DIAGNOSIS — K219 Gastro-esophageal reflux disease without esophagitis: Secondary | ICD-10-CM | POA: Diagnosis not present

## 2017-02-17 DIAGNOSIS — F332 Major depressive disorder, recurrent severe without psychotic features: Secondary | ICD-10-CM | POA: Diagnosis not present

## 2017-02-17 DIAGNOSIS — I10 Essential (primary) hypertension: Secondary | ICD-10-CM | POA: Diagnosis not present

## 2017-02-17 DIAGNOSIS — E039 Hypothyroidism, unspecified: Secondary | ICD-10-CM | POA: Diagnosis not present

## 2017-02-17 DIAGNOSIS — K219 Gastro-esophageal reflux disease without esophagitis: Secondary | ICD-10-CM | POA: Diagnosis not present

## 2017-02-18 DIAGNOSIS — K219 Gastro-esophageal reflux disease without esophagitis: Secondary | ICD-10-CM | POA: Diagnosis not present

## 2017-02-18 DIAGNOSIS — F329 Major depressive disorder, single episode, unspecified: Secondary | ICD-10-CM | POA: Diagnosis not present

## 2017-02-18 DIAGNOSIS — F332 Major depressive disorder, recurrent severe without psychotic features: Secondary | ICD-10-CM | POA: Diagnosis not present

## 2017-02-18 DIAGNOSIS — I10 Essential (primary) hypertension: Secondary | ICD-10-CM | POA: Diagnosis not present

## 2017-02-18 DIAGNOSIS — E039 Hypothyroidism, unspecified: Secondary | ICD-10-CM | POA: Diagnosis not present

## 2017-02-19 DIAGNOSIS — K219 Gastro-esophageal reflux disease without esophagitis: Secondary | ICD-10-CM | POA: Diagnosis not present

## 2017-02-19 DIAGNOSIS — E039 Hypothyroidism, unspecified: Secondary | ICD-10-CM | POA: Diagnosis not present

## 2017-02-19 DIAGNOSIS — F332 Major depressive disorder, recurrent severe without psychotic features: Secondary | ICD-10-CM | POA: Diagnosis not present

## 2017-02-19 DIAGNOSIS — I10 Essential (primary) hypertension: Secondary | ICD-10-CM | POA: Diagnosis not present

## 2017-02-20 DIAGNOSIS — F332 Major depressive disorder, recurrent severe without psychotic features: Secondary | ICD-10-CM | POA: Diagnosis not present

## 2017-02-20 DIAGNOSIS — K219 Gastro-esophageal reflux disease without esophagitis: Secondary | ICD-10-CM | POA: Diagnosis not present

## 2017-02-20 DIAGNOSIS — I10 Essential (primary) hypertension: Secondary | ICD-10-CM | POA: Diagnosis not present

## 2017-02-20 DIAGNOSIS — E039 Hypothyroidism, unspecified: Secondary | ICD-10-CM | POA: Diagnosis not present

## 2017-02-21 DIAGNOSIS — I10 Essential (primary) hypertension: Secondary | ICD-10-CM | POA: Diagnosis not present

## 2017-02-21 DIAGNOSIS — F329 Major depressive disorder, single episode, unspecified: Secondary | ICD-10-CM | POA: Diagnosis not present

## 2017-02-21 DIAGNOSIS — K219 Gastro-esophageal reflux disease without esophagitis: Secondary | ICD-10-CM | POA: Diagnosis not present

## 2017-02-21 DIAGNOSIS — F332 Major depressive disorder, recurrent severe without psychotic features: Secondary | ICD-10-CM | POA: Diagnosis not present

## 2017-02-21 DIAGNOSIS — E039 Hypothyroidism, unspecified: Secondary | ICD-10-CM | POA: Diagnosis not present

## 2017-02-22 DIAGNOSIS — F332 Major depressive disorder, recurrent severe without psychotic features: Secondary | ICD-10-CM | POA: Diagnosis not present

## 2017-02-22 DIAGNOSIS — K219 Gastro-esophageal reflux disease without esophagitis: Secondary | ICD-10-CM | POA: Diagnosis not present

## 2017-02-22 DIAGNOSIS — I10 Essential (primary) hypertension: Secondary | ICD-10-CM | POA: Diagnosis not present

## 2017-02-22 DIAGNOSIS — E039 Hypothyroidism, unspecified: Secondary | ICD-10-CM | POA: Diagnosis not present

## 2017-02-23 DIAGNOSIS — K219 Gastro-esophageal reflux disease without esophagitis: Secondary | ICD-10-CM | POA: Diagnosis not present

## 2017-02-23 DIAGNOSIS — F329 Major depressive disorder, single episode, unspecified: Secondary | ICD-10-CM | POA: Diagnosis not present

## 2017-02-23 DIAGNOSIS — I10 Essential (primary) hypertension: Secondary | ICD-10-CM | POA: Diagnosis not present

## 2017-02-23 DIAGNOSIS — F332 Major depressive disorder, recurrent severe without psychotic features: Secondary | ICD-10-CM | POA: Diagnosis not present

## 2017-02-23 DIAGNOSIS — E039 Hypothyroidism, unspecified: Secondary | ICD-10-CM | POA: Diagnosis not present

## 2017-02-24 DIAGNOSIS — F332 Major depressive disorder, recurrent severe without psychotic features: Secondary | ICD-10-CM | POA: Diagnosis not present

## 2017-02-24 DIAGNOSIS — K219 Gastro-esophageal reflux disease without esophagitis: Secondary | ICD-10-CM | POA: Diagnosis not present

## 2017-02-24 DIAGNOSIS — I10 Essential (primary) hypertension: Secondary | ICD-10-CM | POA: Diagnosis not present

## 2017-02-24 DIAGNOSIS — E039 Hypothyroidism, unspecified: Secondary | ICD-10-CM | POA: Diagnosis not present

## 2017-02-25 DIAGNOSIS — F329 Major depressive disorder, single episode, unspecified: Secondary | ICD-10-CM | POA: Diagnosis not present

## 2017-02-25 DIAGNOSIS — E039 Hypothyroidism, unspecified: Secondary | ICD-10-CM | POA: Diagnosis not present

## 2017-02-25 DIAGNOSIS — Z452 Encounter for adjustment and management of vascular access device: Secondary | ICD-10-CM | POA: Diagnosis not present

## 2017-02-25 DIAGNOSIS — K219 Gastro-esophageal reflux disease without esophagitis: Secondary | ICD-10-CM | POA: Diagnosis not present

## 2017-02-25 DIAGNOSIS — F332 Major depressive disorder, recurrent severe without psychotic features: Secondary | ICD-10-CM | POA: Diagnosis not present

## 2017-02-25 DIAGNOSIS — I10 Essential (primary) hypertension: Secondary | ICD-10-CM | POA: Diagnosis not present

## 2017-02-26 DIAGNOSIS — E039 Hypothyroidism, unspecified: Secondary | ICD-10-CM | POA: Diagnosis not present

## 2017-02-26 DIAGNOSIS — F332 Major depressive disorder, recurrent severe without psychotic features: Secondary | ICD-10-CM | POA: Diagnosis not present

## 2017-02-26 DIAGNOSIS — I10 Essential (primary) hypertension: Secondary | ICD-10-CM | POA: Diagnosis not present

## 2017-02-26 DIAGNOSIS — K219 Gastro-esophageal reflux disease without esophagitis: Secondary | ICD-10-CM | POA: Diagnosis not present

## 2017-02-27 DIAGNOSIS — E039 Hypothyroidism, unspecified: Secondary | ICD-10-CM | POA: Diagnosis not present

## 2017-02-27 DIAGNOSIS — K219 Gastro-esophageal reflux disease without esophagitis: Secondary | ICD-10-CM | POA: Diagnosis not present

## 2017-02-27 DIAGNOSIS — F332 Major depressive disorder, recurrent severe without psychotic features: Secondary | ICD-10-CM | POA: Diagnosis not present

## 2017-02-27 DIAGNOSIS — I10 Essential (primary) hypertension: Secondary | ICD-10-CM | POA: Diagnosis not present

## 2017-02-28 DIAGNOSIS — E039 Hypothyroidism, unspecified: Secondary | ICD-10-CM | POA: Diagnosis not present

## 2017-02-28 DIAGNOSIS — I10 Essential (primary) hypertension: Secondary | ICD-10-CM | POA: Diagnosis not present

## 2017-02-28 DIAGNOSIS — K219 Gastro-esophageal reflux disease without esophagitis: Secondary | ICD-10-CM | POA: Diagnosis not present

## 2017-02-28 DIAGNOSIS — F332 Major depressive disorder, recurrent severe without psychotic features: Secondary | ICD-10-CM | POA: Diagnosis not present

## 2017-02-28 DIAGNOSIS — F329 Major depressive disorder, single episode, unspecified: Secondary | ICD-10-CM | POA: Diagnosis not present

## 2017-03-01 DIAGNOSIS — E039 Hypothyroidism, unspecified: Secondary | ICD-10-CM | POA: Diagnosis not present

## 2017-03-01 DIAGNOSIS — I10 Essential (primary) hypertension: Secondary | ICD-10-CM | POA: Diagnosis not present

## 2017-03-01 DIAGNOSIS — K219 Gastro-esophageal reflux disease without esophagitis: Secondary | ICD-10-CM | POA: Diagnosis not present

## 2017-03-01 DIAGNOSIS — F332 Major depressive disorder, recurrent severe without psychotic features: Secondary | ICD-10-CM | POA: Diagnosis not present

## 2017-03-02 DIAGNOSIS — E039 Hypothyroidism, unspecified: Secondary | ICD-10-CM | POA: Diagnosis not present

## 2017-03-02 DIAGNOSIS — K219 Gastro-esophageal reflux disease without esophagitis: Secondary | ICD-10-CM | POA: Diagnosis not present

## 2017-03-02 DIAGNOSIS — F332 Major depressive disorder, recurrent severe without psychotic features: Secondary | ICD-10-CM | POA: Diagnosis not present

## 2017-03-02 DIAGNOSIS — I10 Essential (primary) hypertension: Secondary | ICD-10-CM | POA: Diagnosis not present

## 2017-03-02 DIAGNOSIS — F329 Major depressive disorder, single episode, unspecified: Secondary | ICD-10-CM | POA: Diagnosis not present

## 2017-03-03 DIAGNOSIS — F332 Major depressive disorder, recurrent severe without psychotic features: Secondary | ICD-10-CM | POA: Diagnosis not present

## 2017-03-03 DIAGNOSIS — K219 Gastro-esophageal reflux disease without esophagitis: Secondary | ICD-10-CM | POA: Diagnosis not present

## 2017-03-03 DIAGNOSIS — E039 Hypothyroidism, unspecified: Secondary | ICD-10-CM | POA: Diagnosis not present

## 2017-03-03 DIAGNOSIS — I10 Essential (primary) hypertension: Secondary | ICD-10-CM | POA: Diagnosis not present

## 2017-03-04 DIAGNOSIS — K219 Gastro-esophageal reflux disease without esophagitis: Secondary | ICD-10-CM | POA: Diagnosis not present

## 2017-03-04 DIAGNOSIS — E039 Hypothyroidism, unspecified: Secondary | ICD-10-CM | POA: Diagnosis not present

## 2017-03-04 DIAGNOSIS — F329 Major depressive disorder, single episode, unspecified: Secondary | ICD-10-CM | POA: Diagnosis not present

## 2017-03-04 DIAGNOSIS — I10 Essential (primary) hypertension: Secondary | ICD-10-CM | POA: Diagnosis not present

## 2017-03-04 DIAGNOSIS — F332 Major depressive disorder, recurrent severe without psychotic features: Secondary | ICD-10-CM | POA: Diagnosis not present

## 2017-03-05 DIAGNOSIS — I10 Essential (primary) hypertension: Secondary | ICD-10-CM | POA: Diagnosis not present

## 2017-03-05 DIAGNOSIS — F332 Major depressive disorder, recurrent severe without psychotic features: Secondary | ICD-10-CM | POA: Diagnosis not present

## 2017-03-05 DIAGNOSIS — K219 Gastro-esophageal reflux disease without esophagitis: Secondary | ICD-10-CM | POA: Diagnosis not present

## 2017-03-05 DIAGNOSIS — E039 Hypothyroidism, unspecified: Secondary | ICD-10-CM | POA: Diagnosis not present

## 2017-03-06 DIAGNOSIS — E039 Hypothyroidism, unspecified: Secondary | ICD-10-CM | POA: Diagnosis not present

## 2017-03-06 DIAGNOSIS — F332 Major depressive disorder, recurrent severe without psychotic features: Secondary | ICD-10-CM | POA: Diagnosis not present

## 2017-03-06 DIAGNOSIS — K219 Gastro-esophageal reflux disease without esophagitis: Secondary | ICD-10-CM | POA: Diagnosis not present

## 2017-03-06 DIAGNOSIS — I10 Essential (primary) hypertension: Secondary | ICD-10-CM | POA: Diagnosis not present

## 2017-03-07 DIAGNOSIS — F332 Major depressive disorder, recurrent severe without psychotic features: Secondary | ICD-10-CM | POA: Diagnosis not present

## 2017-03-07 DIAGNOSIS — K219 Gastro-esophageal reflux disease without esophagitis: Secondary | ICD-10-CM | POA: Diagnosis not present

## 2017-03-07 DIAGNOSIS — E039 Hypothyroidism, unspecified: Secondary | ICD-10-CM | POA: Diagnosis not present

## 2017-03-07 DIAGNOSIS — I10 Essential (primary) hypertension: Secondary | ICD-10-CM | POA: Diagnosis not present

## 2017-03-07 DIAGNOSIS — F329 Major depressive disorder, single episode, unspecified: Secondary | ICD-10-CM | POA: Diagnosis not present

## 2017-03-08 DIAGNOSIS — K219 Gastro-esophageal reflux disease without esophagitis: Secondary | ICD-10-CM | POA: Diagnosis not present

## 2017-03-08 DIAGNOSIS — E039 Hypothyroidism, unspecified: Secondary | ICD-10-CM | POA: Diagnosis not present

## 2017-03-08 DIAGNOSIS — I10 Essential (primary) hypertension: Secondary | ICD-10-CM | POA: Diagnosis not present

## 2017-03-08 DIAGNOSIS — F332 Major depressive disorder, recurrent severe without psychotic features: Secondary | ICD-10-CM | POA: Diagnosis not present

## 2017-03-09 DIAGNOSIS — F332 Major depressive disorder, recurrent severe without psychotic features: Secondary | ICD-10-CM | POA: Diagnosis not present

## 2017-03-09 DIAGNOSIS — I10 Essential (primary) hypertension: Secondary | ICD-10-CM | POA: Diagnosis not present

## 2017-03-09 DIAGNOSIS — K219 Gastro-esophageal reflux disease without esophagitis: Secondary | ICD-10-CM | POA: Diagnosis not present

## 2017-03-09 DIAGNOSIS — E039 Hypothyroidism, unspecified: Secondary | ICD-10-CM | POA: Diagnosis not present

## 2017-03-09 DIAGNOSIS — F329 Major depressive disorder, single episode, unspecified: Secondary | ICD-10-CM | POA: Diagnosis not present

## 2017-03-10 DIAGNOSIS — F332 Major depressive disorder, recurrent severe without psychotic features: Secondary | ICD-10-CM | POA: Diagnosis not present

## 2017-03-10 DIAGNOSIS — E039 Hypothyroidism, unspecified: Secondary | ICD-10-CM | POA: Diagnosis not present

## 2017-03-10 DIAGNOSIS — K219 Gastro-esophageal reflux disease without esophagitis: Secondary | ICD-10-CM | POA: Diagnosis not present

## 2017-03-10 DIAGNOSIS — I10 Essential (primary) hypertension: Secondary | ICD-10-CM | POA: Diagnosis not present

## 2017-03-11 ENCOUNTER — Encounter: Payer: Self-pay | Admitting: Gynecology

## 2017-03-11 DIAGNOSIS — Z0289 Encounter for other administrative examinations: Secondary | ICD-10-CM

## 2017-03-11 DIAGNOSIS — I10 Essential (primary) hypertension: Secondary | ICD-10-CM | POA: Diagnosis not present

## 2017-03-11 DIAGNOSIS — F332 Major depressive disorder, recurrent severe without psychotic features: Secondary | ICD-10-CM | POA: Diagnosis not present

## 2017-03-11 DIAGNOSIS — K219 Gastro-esophageal reflux disease without esophagitis: Secondary | ICD-10-CM | POA: Diagnosis not present

## 2017-03-11 DIAGNOSIS — E039 Hypothyroidism, unspecified: Secondary | ICD-10-CM | POA: Diagnosis not present

## 2017-03-11 DIAGNOSIS — F329 Major depressive disorder, single episode, unspecified: Secondary | ICD-10-CM | POA: Diagnosis not present

## 2017-03-12 DIAGNOSIS — F332 Major depressive disorder, recurrent severe without psychotic features: Secondary | ICD-10-CM | POA: Diagnosis not present

## 2017-03-12 DIAGNOSIS — K219 Gastro-esophageal reflux disease without esophagitis: Secondary | ICD-10-CM | POA: Diagnosis not present

## 2017-03-12 DIAGNOSIS — E039 Hypothyroidism, unspecified: Secondary | ICD-10-CM | POA: Diagnosis not present

## 2017-03-12 DIAGNOSIS — I10 Essential (primary) hypertension: Secondary | ICD-10-CM | POA: Diagnosis not present

## 2017-03-13 DIAGNOSIS — K219 Gastro-esophageal reflux disease without esophagitis: Secondary | ICD-10-CM | POA: Diagnosis not present

## 2017-03-13 DIAGNOSIS — E039 Hypothyroidism, unspecified: Secondary | ICD-10-CM | POA: Diagnosis not present

## 2017-03-13 DIAGNOSIS — F332 Major depressive disorder, recurrent severe without psychotic features: Secondary | ICD-10-CM | POA: Diagnosis not present

## 2017-03-13 DIAGNOSIS — I10 Essential (primary) hypertension: Secondary | ICD-10-CM | POA: Diagnosis not present

## 2017-03-14 DIAGNOSIS — F332 Major depressive disorder, recurrent severe without psychotic features: Secondary | ICD-10-CM | POA: Diagnosis not present

## 2017-03-14 DIAGNOSIS — E039 Hypothyroidism, unspecified: Secondary | ICD-10-CM | POA: Diagnosis not present

## 2017-03-14 DIAGNOSIS — K219 Gastro-esophageal reflux disease without esophagitis: Secondary | ICD-10-CM | POA: Diagnosis not present

## 2017-03-14 DIAGNOSIS — F329 Major depressive disorder, single episode, unspecified: Secondary | ICD-10-CM | POA: Diagnosis not present

## 2017-03-14 DIAGNOSIS — I10 Essential (primary) hypertension: Secondary | ICD-10-CM | POA: Diagnosis not present

## 2017-03-15 DIAGNOSIS — F332 Major depressive disorder, recurrent severe without psychotic features: Secondary | ICD-10-CM | POA: Diagnosis not present

## 2017-03-15 DIAGNOSIS — E039 Hypothyroidism, unspecified: Secondary | ICD-10-CM | POA: Diagnosis not present

## 2017-03-15 DIAGNOSIS — I10 Essential (primary) hypertension: Secondary | ICD-10-CM | POA: Diagnosis not present

## 2017-03-15 DIAGNOSIS — K219 Gastro-esophageal reflux disease without esophagitis: Secondary | ICD-10-CM | POA: Diagnosis not present

## 2017-03-16 DIAGNOSIS — F329 Major depressive disorder, single episode, unspecified: Secondary | ICD-10-CM | POA: Diagnosis not present

## 2017-03-16 DIAGNOSIS — I10 Essential (primary) hypertension: Secondary | ICD-10-CM | POA: Diagnosis not present

## 2017-03-16 DIAGNOSIS — K219 Gastro-esophageal reflux disease without esophagitis: Secondary | ICD-10-CM | POA: Diagnosis not present

## 2017-03-16 DIAGNOSIS — E039 Hypothyroidism, unspecified: Secondary | ICD-10-CM | POA: Diagnosis not present

## 2017-03-16 DIAGNOSIS — F332 Major depressive disorder, recurrent severe without psychotic features: Secondary | ICD-10-CM | POA: Diagnosis not present

## 2017-03-17 DIAGNOSIS — E039 Hypothyroidism, unspecified: Secondary | ICD-10-CM | POA: Diagnosis not present

## 2017-03-17 DIAGNOSIS — I10 Essential (primary) hypertension: Secondary | ICD-10-CM | POA: Diagnosis not present

## 2017-03-17 DIAGNOSIS — F332 Major depressive disorder, recurrent severe without psychotic features: Secondary | ICD-10-CM | POA: Diagnosis not present

## 2017-03-17 DIAGNOSIS — K219 Gastro-esophageal reflux disease without esophagitis: Secondary | ICD-10-CM | POA: Diagnosis not present

## 2017-05-04 DIAGNOSIS — F332 Major depressive disorder, recurrent severe without psychotic features: Secondary | ICD-10-CM | POA: Diagnosis not present

## 2017-05-12 DIAGNOSIS — H906 Mixed conductive and sensorineural hearing loss, bilateral: Secondary | ICD-10-CM | POA: Diagnosis not present

## 2017-05-12 DIAGNOSIS — H6123 Impacted cerumen, bilateral: Secondary | ICD-10-CM | POA: Diagnosis not present

## 2017-05-12 DIAGNOSIS — H60332 Swimmer's ear, left ear: Secondary | ICD-10-CM | POA: Diagnosis not present

## 2017-05-12 DIAGNOSIS — H6983 Other specified disorders of Eustachian tube, bilateral: Secondary | ICD-10-CM | POA: Diagnosis not present

## 2017-05-17 DIAGNOSIS — F332 Major depressive disorder, recurrent severe without psychotic features: Secondary | ICD-10-CM | POA: Diagnosis not present

## 2017-06-07 DIAGNOSIS — F332 Major depressive disorder, recurrent severe without psychotic features: Secondary | ICD-10-CM | POA: Diagnosis not present

## 2017-06-08 DIAGNOSIS — F332 Major depressive disorder, recurrent severe without psychotic features: Secondary | ICD-10-CM | POA: Diagnosis not present

## 2017-06-16 DIAGNOSIS — H6123 Impacted cerumen, bilateral: Secondary | ICD-10-CM | POA: Diagnosis not present

## 2017-06-16 DIAGNOSIS — H6983 Other specified disorders of Eustachian tube, bilateral: Secondary | ICD-10-CM | POA: Diagnosis not present

## 2017-06-16 DIAGNOSIS — H906 Mixed conductive and sensorineural hearing loss, bilateral: Secondary | ICD-10-CM | POA: Diagnosis not present

## 2017-07-06 DIAGNOSIS — H60332 Swimmer's ear, left ear: Secondary | ICD-10-CM | POA: Diagnosis not present

## 2017-07-06 DIAGNOSIS — H6983 Other specified disorders of Eustachian tube, bilateral: Secondary | ICD-10-CM | POA: Diagnosis not present

## 2017-07-06 DIAGNOSIS — H906 Mixed conductive and sensorineural hearing loss, bilateral: Secondary | ICD-10-CM | POA: Diagnosis not present

## 2017-07-06 DIAGNOSIS — H6123 Impacted cerumen, bilateral: Secondary | ICD-10-CM | POA: Diagnosis not present

## 2017-07-21 DIAGNOSIS — F332 Major depressive disorder, recurrent severe without psychotic features: Secondary | ICD-10-CM | POA: Diagnosis not present

## 2017-08-03 DIAGNOSIS — F332 Major depressive disorder, recurrent severe without psychotic features: Secondary | ICD-10-CM | POA: Diagnosis not present

## 2017-08-04 DIAGNOSIS — K080 Exfoliation of teeth due to systemic causes: Secondary | ICD-10-CM | POA: Diagnosis not present

## 2017-09-02 DIAGNOSIS — F332 Major depressive disorder, recurrent severe without psychotic features: Secondary | ICD-10-CM | POA: Diagnosis not present

## 2017-09-06 DIAGNOSIS — H6123 Impacted cerumen, bilateral: Secondary | ICD-10-CM | POA: Diagnosis not present

## 2017-09-06 DIAGNOSIS — H60332 Swimmer's ear, left ear: Secondary | ICD-10-CM | POA: Diagnosis not present

## 2017-09-06 DIAGNOSIS — H903 Sensorineural hearing loss, bilateral: Secondary | ICD-10-CM | POA: Diagnosis not present

## 2017-09-06 DIAGNOSIS — H6983 Other specified disorders of Eustachian tube, bilateral: Secondary | ICD-10-CM | POA: Diagnosis not present

## 2017-10-04 DIAGNOSIS — F332 Major depressive disorder, recurrent severe without psychotic features: Secondary | ICD-10-CM | POA: Diagnosis not present

## 2017-10-05 DIAGNOSIS — F3170 Bipolar disorder, currently in remission, most recent episode unspecified: Secondary | ICD-10-CM | POA: Diagnosis not present

## 2017-12-07 DIAGNOSIS — E039 Hypothyroidism, unspecified: Secondary | ICD-10-CM | POA: Diagnosis not present

## 2017-12-07 DIAGNOSIS — E7800 Pure hypercholesterolemia, unspecified: Secondary | ICD-10-CM | POA: Diagnosis not present

## 2017-12-07 DIAGNOSIS — E119 Type 2 diabetes mellitus without complications: Secondary | ICD-10-CM | POA: Diagnosis not present

## 2017-12-07 DIAGNOSIS — I10 Essential (primary) hypertension: Secondary | ICD-10-CM | POA: Diagnosis not present

## 2017-12-07 DIAGNOSIS — K219 Gastro-esophageal reflux disease without esophagitis: Secondary | ICD-10-CM | POA: Diagnosis not present

## 2017-12-12 DIAGNOSIS — F331 Major depressive disorder, recurrent, moderate: Secondary | ICD-10-CM | POA: Diagnosis not present

## 2017-12-27 DIAGNOSIS — F332 Major depressive disorder, recurrent severe without psychotic features: Secondary | ICD-10-CM | POA: Diagnosis not present

## 2018-01-05 DIAGNOSIS — H6983 Other specified disorders of Eustachian tube, bilateral: Secondary | ICD-10-CM | POA: Diagnosis not present

## 2018-01-05 DIAGNOSIS — H906 Mixed conductive and sensorineural hearing loss, bilateral: Secondary | ICD-10-CM | POA: Diagnosis not present

## 2018-01-05 DIAGNOSIS — H6123 Impacted cerumen, bilateral: Secondary | ICD-10-CM | POA: Diagnosis not present

## 2018-01-10 DIAGNOSIS — F3340 Major depressive disorder, recurrent, in remission, unspecified: Secondary | ICD-10-CM | POA: Diagnosis not present

## 2018-03-09 DIAGNOSIS — F332 Major depressive disorder, recurrent severe without psychotic features: Secondary | ICD-10-CM | POA: Diagnosis not present

## 2018-03-20 DIAGNOSIS — H6123 Impacted cerumen, bilateral: Secondary | ICD-10-CM | POA: Diagnosis not present

## 2018-03-20 DIAGNOSIS — H60332 Swimmer's ear, left ear: Secondary | ICD-10-CM | POA: Diagnosis not present

## 2018-03-20 DIAGNOSIS — H73002 Acute myringitis, left ear: Secondary | ICD-10-CM | POA: Diagnosis not present

## 2018-03-20 DIAGNOSIS — H906 Mixed conductive and sensorineural hearing loss, bilateral: Secondary | ICD-10-CM | POA: Diagnosis not present

## 2018-03-29 DIAGNOSIS — F3340 Major depressive disorder, recurrent, in remission, unspecified: Secondary | ICD-10-CM | POA: Diagnosis not present

## 2018-04-18 DIAGNOSIS — F332 Major depressive disorder, recurrent severe without psychotic features: Secondary | ICD-10-CM | POA: Diagnosis not present

## 2018-05-17 DIAGNOSIS — F332 Major depressive disorder, recurrent severe without psychotic features: Secondary | ICD-10-CM | POA: Diagnosis not present

## 2018-05-30 DIAGNOSIS — E119 Type 2 diabetes mellitus without complications: Secondary | ICD-10-CM | POA: Diagnosis not present

## 2018-05-30 DIAGNOSIS — Z23 Encounter for immunization: Secondary | ICD-10-CM | POA: Diagnosis not present

## 2018-05-30 DIAGNOSIS — I10 Essential (primary) hypertension: Secondary | ICD-10-CM | POA: Diagnosis not present

## 2018-05-30 DIAGNOSIS — K219 Gastro-esophageal reflux disease without esophagitis: Secondary | ICD-10-CM | POA: Diagnosis not present

## 2018-06-20 DIAGNOSIS — F332 Major depressive disorder, recurrent severe without psychotic features: Secondary | ICD-10-CM | POA: Diagnosis not present

## 2018-06-26 DIAGNOSIS — I10 Essential (primary) hypertension: Secondary | ICD-10-CM | POA: Diagnosis not present

## 2018-07-26 DIAGNOSIS — F332 Major depressive disorder, recurrent severe without psychotic features: Secondary | ICD-10-CM | POA: Diagnosis not present

## 2018-08-28 ENCOUNTER — Other Ambulatory Visit: Payer: Self-pay | Admitting: Family Medicine

## 2018-08-30 DIAGNOSIS — E1165 Type 2 diabetes mellitus with hyperglycemia: Secondary | ICD-10-CM | POA: Diagnosis not present

## 2018-08-30 DIAGNOSIS — E7800 Pure hypercholesterolemia, unspecified: Secondary | ICD-10-CM | POA: Diagnosis not present

## 2018-08-30 DIAGNOSIS — I10 Essential (primary) hypertension: Secondary | ICD-10-CM | POA: Diagnosis not present

## 2018-08-30 DIAGNOSIS — E039 Hypothyroidism, unspecified: Secondary | ICD-10-CM | POA: Diagnosis not present

## 2018-08-31 ENCOUNTER — Other Ambulatory Visit: Payer: Self-pay | Admitting: Family Medicine

## 2018-08-31 DIAGNOSIS — Z1231 Encounter for screening mammogram for malignant neoplasm of breast: Secondary | ICD-10-CM

## 2018-09-04 ENCOUNTER — Ambulatory Visit
Admission: RE | Admit: 2018-09-04 | Discharge: 2018-09-04 | Disposition: A | Discharge status: Home / Self Care | Payer: BLUE CROSS/BLUE SHIELD | Source: Ambulatory Visit | Attending: Family Medicine | Admitting: Family Medicine

## 2018-09-04 DIAGNOSIS — Z1231 Encounter for screening mammogram for malignant neoplasm of breast: Secondary | ICD-10-CM | POA: Diagnosis not present

## 2018-09-05 DIAGNOSIS — F332 Major depressive disorder, recurrent severe without psychotic features: Secondary | ICD-10-CM | POA: Diagnosis not present

## 2018-09-18 DIAGNOSIS — I10 Essential (primary) hypertension: Secondary | ICD-10-CM | POA: Diagnosis not present

## 2018-09-27 DIAGNOSIS — H35013 Changes in retinal vascular appearance, bilateral: Secondary | ICD-10-CM | POA: Diagnosis not present

## 2018-10-13 DIAGNOSIS — I10 Essential (primary) hypertension: Secondary | ICD-10-CM | POA: Diagnosis not present

## 2018-10-13 DIAGNOSIS — E119 Type 2 diabetes mellitus without complications: Secondary | ICD-10-CM | POA: Diagnosis not present

## 2018-10-13 DIAGNOSIS — F329 Major depressive disorder, single episode, unspecified: Secondary | ICD-10-CM | POA: Diagnosis not present

## 2018-10-13 DIAGNOSIS — G4700 Insomnia, unspecified: Secondary | ICD-10-CM | POA: Diagnosis not present

## 2018-10-13 DIAGNOSIS — Z1389 Encounter for screening for other disorder: Secondary | ICD-10-CM | POA: Diagnosis not present

## 2018-10-13 DIAGNOSIS — Z124 Encounter for screening for malignant neoplasm of cervix: Secondary | ICD-10-CM | POA: Diagnosis not present

## 2018-10-16 DIAGNOSIS — H35013 Changes in retinal vascular appearance, bilateral: Secondary | ICD-10-CM | POA: Diagnosis not present

## 2018-10-30 DIAGNOSIS — Z1159 Encounter for screening for other viral diseases: Secondary | ICD-10-CM | POA: Diagnosis not present

## 2018-10-30 DIAGNOSIS — G4700 Insomnia, unspecified: Secondary | ICD-10-CM | POA: Diagnosis not present

## 2018-10-30 DIAGNOSIS — E785 Hyperlipidemia, unspecified: Secondary | ICD-10-CM | POA: Diagnosis not present

## 2018-10-30 DIAGNOSIS — I10 Essential (primary) hypertension: Secondary | ICD-10-CM | POA: Diagnosis not present

## 2018-11-01 DIAGNOSIS — L0232 Furuncle of buttock: Secondary | ICD-10-CM | POA: Diagnosis not present

## 2018-11-01 DIAGNOSIS — E119 Type 2 diabetes mellitus without complications: Secondary | ICD-10-CM | POA: Diagnosis not present

## 2018-11-01 DIAGNOSIS — I10 Essential (primary) hypertension: Secondary | ICD-10-CM | POA: Diagnosis not present

## 2018-11-01 DIAGNOSIS — E039 Hypothyroidism, unspecified: Secondary | ICD-10-CM | POA: Diagnosis not present

## 2018-11-23 DIAGNOSIS — I10 Essential (primary) hypertension: Secondary | ICD-10-CM | POA: Diagnosis not present

## 2018-11-28 DIAGNOSIS — H7201 Central perforation of tympanic membrane, right ear: Secondary | ICD-10-CM | POA: Diagnosis not present

## 2018-11-28 DIAGNOSIS — H6123 Impacted cerumen, bilateral: Secondary | ICD-10-CM | POA: Diagnosis not present

## 2018-11-28 DIAGNOSIS — H906 Mixed conductive and sensorineural hearing loss, bilateral: Secondary | ICD-10-CM | POA: Diagnosis not present

## 2018-11-28 DIAGNOSIS — H6983 Other specified disorders of Eustachian tube, bilateral: Secondary | ICD-10-CM | POA: Diagnosis not present

## 2018-11-29 DIAGNOSIS — E039 Hypothyroidism, unspecified: Secondary | ICD-10-CM | POA: Diagnosis not present

## 2018-11-29 DIAGNOSIS — I10 Essential (primary) hypertension: Secondary | ICD-10-CM | POA: Diagnosis not present

## 2018-11-29 DIAGNOSIS — E119 Type 2 diabetes mellitus without complications: Secondary | ICD-10-CM | POA: Diagnosis not present

## 2018-11-29 DIAGNOSIS — E7800 Pure hypercholesterolemia, unspecified: Secondary | ICD-10-CM | POA: Diagnosis not present

## 2018-12-12 DIAGNOSIS — F332 Major depressive disorder, recurrent severe without psychotic features: Secondary | ICD-10-CM | POA: Diagnosis not present

## 2018-12-20 DIAGNOSIS — E119 Type 2 diabetes mellitus without complications: Secondary | ICD-10-CM | POA: Diagnosis not present

## 2018-12-20 DIAGNOSIS — I10 Essential (primary) hypertension: Secondary | ICD-10-CM | POA: Diagnosis not present

## 2018-12-20 DIAGNOSIS — M65311 Trigger thumb, right thumb: Secondary | ICD-10-CM | POA: Diagnosis not present

## 2018-12-20 DIAGNOSIS — L989 Disorder of the skin and subcutaneous tissue, unspecified: Secondary | ICD-10-CM | POA: Diagnosis not present

## 2019-01-26 DIAGNOSIS — F332 Major depressive disorder, recurrent severe without psychotic features: Secondary | ICD-10-CM | POA: Diagnosis not present

## 2019-02-07 DIAGNOSIS — E119 Type 2 diabetes mellitus without complications: Secondary | ICD-10-CM | POA: Diagnosis not present

## 2019-02-07 DIAGNOSIS — I10 Essential (primary) hypertension: Secondary | ICD-10-CM | POA: Diagnosis not present

## 2019-02-08 DIAGNOSIS — E785 Hyperlipidemia, unspecified: Secondary | ICD-10-CM | POA: Diagnosis not present

## 2019-02-08 DIAGNOSIS — E119 Type 2 diabetes mellitus without complications: Secondary | ICD-10-CM | POA: Diagnosis not present

## 2019-02-08 DIAGNOSIS — G8929 Other chronic pain: Secondary | ICD-10-CM | POA: Diagnosis not present

## 2019-02-08 DIAGNOSIS — I10 Essential (primary) hypertension: Secondary | ICD-10-CM | POA: Diagnosis not present

## 2019-02-15 DIAGNOSIS — I10 Essential (primary) hypertension: Secondary | ICD-10-CM | POA: Diagnosis not present

## 2019-02-20 DIAGNOSIS — F332 Major depressive disorder, recurrent severe without psychotic features: Secondary | ICD-10-CM | POA: Diagnosis not present

## 2019-02-22 DIAGNOSIS — R011 Cardiac murmur, unspecified: Secondary | ICD-10-CM | POA: Diagnosis not present

## 2019-03-12 DIAGNOSIS — G4700 Insomnia, unspecified: Secondary | ICD-10-CM | POA: Diagnosis not present

## 2019-03-12 DIAGNOSIS — R931 Abnormal findings on diagnostic imaging of heart and coronary circulation: Secondary | ICD-10-CM | POA: Diagnosis not present

## 2019-03-12 DIAGNOSIS — K589 Irritable bowel syndrome without diarrhea: Secondary | ICD-10-CM | POA: Diagnosis not present

## 2019-03-12 DIAGNOSIS — I10 Essential (primary) hypertension: Secondary | ICD-10-CM | POA: Diagnosis not present

## 2019-03-22 DIAGNOSIS — F332 Major depressive disorder, recurrent severe without psychotic features: Secondary | ICD-10-CM | POA: Diagnosis not present

## 2019-04-02 DIAGNOSIS — R931 Abnormal findings on diagnostic imaging of heart and coronary circulation: Secondary | ICD-10-CM | POA: Diagnosis not present

## 2019-04-02 DIAGNOSIS — I493 Ventricular premature depolarization: Secondary | ICD-10-CM | POA: Diagnosis not present

## 2019-04-02 DIAGNOSIS — I491 Atrial premature depolarization: Secondary | ICD-10-CM | POA: Diagnosis not present

## 2019-04-23 DIAGNOSIS — I10 Essential (primary) hypertension: Secondary | ICD-10-CM | POA: Diagnosis not present

## 2019-04-23 DIAGNOSIS — E785 Hyperlipidemia, unspecified: Secondary | ICD-10-CM | POA: Diagnosis not present

## 2019-04-23 DIAGNOSIS — R931 Abnormal findings on diagnostic imaging of heart and coronary circulation: Secondary | ICD-10-CM | POA: Diagnosis not present

## 2019-04-23 DIAGNOSIS — R9439 Abnormal result of other cardiovascular function study: Secondary | ICD-10-CM | POA: Diagnosis not present

## 2019-04-24 DIAGNOSIS — F332 Major depressive disorder, recurrent severe without psychotic features: Secondary | ICD-10-CM | POA: Diagnosis not present

## 2019-05-09 DIAGNOSIS — Z03818 Encounter for observation for suspected exposure to other biological agents ruled out: Secondary | ICD-10-CM | POA: Diagnosis not present

## 2019-05-15 DIAGNOSIS — R079 Chest pain, unspecified: Secondary | ICD-10-CM | POA: Diagnosis not present

## 2019-05-15 DIAGNOSIS — R918 Other nonspecific abnormal finding of lung field: Secondary | ICD-10-CM | POA: Diagnosis not present

## 2019-05-15 DIAGNOSIS — I2510 Atherosclerotic heart disease of native coronary artery without angina pectoris: Secondary | ICD-10-CM | POA: Diagnosis not present

## 2019-05-16 DIAGNOSIS — F329 Major depressive disorder, single episode, unspecified: Secondary | ICD-10-CM | POA: Diagnosis not present

## 2019-05-17 DIAGNOSIS — E785 Hyperlipidemia, unspecified: Secondary | ICD-10-CM | POA: Diagnosis not present

## 2019-05-17 DIAGNOSIS — E039 Hypothyroidism, unspecified: Secondary | ICD-10-CM | POA: Diagnosis not present

## 2019-05-17 DIAGNOSIS — G8929 Other chronic pain: Secondary | ICD-10-CM | POA: Diagnosis not present

## 2019-05-17 DIAGNOSIS — I10 Essential (primary) hypertension: Secondary | ICD-10-CM | POA: Diagnosis not present

## 2019-05-18 DIAGNOSIS — E785 Hyperlipidemia, unspecified: Secondary | ICD-10-CM | POA: Diagnosis not present

## 2019-05-18 DIAGNOSIS — I10 Essential (primary) hypertension: Secondary | ICD-10-CM | POA: Diagnosis not present

## 2019-05-18 DIAGNOSIS — I2510 Atherosclerotic heart disease of native coronary artery without angina pectoris: Secondary | ICD-10-CM | POA: Diagnosis not present

## 2019-05-18 DIAGNOSIS — E119 Type 2 diabetes mellitus without complications: Secondary | ICD-10-CM | POA: Diagnosis not present

## 2019-05-22 DIAGNOSIS — E039 Hypothyroidism, unspecified: Secondary | ICD-10-CM | POA: Diagnosis not present

## 2019-05-22 DIAGNOSIS — I2510 Atherosclerotic heart disease of native coronary artery without angina pectoris: Secondary | ICD-10-CM | POA: Diagnosis not present

## 2019-05-22 DIAGNOSIS — I10 Essential (primary) hypertension: Secondary | ICD-10-CM | POA: Diagnosis not present

## 2019-05-22 DIAGNOSIS — R399 Unspecified symptoms and signs involving the genitourinary system: Secondary | ICD-10-CM | POA: Diagnosis not present

## 2019-05-22 DIAGNOSIS — R300 Dysuria: Secondary | ICD-10-CM | POA: Diagnosis not present

## 2019-06-11 DIAGNOSIS — R911 Solitary pulmonary nodule: Secondary | ICD-10-CM | POA: Diagnosis not present

## 2019-06-26 DIAGNOSIS — F332 Major depressive disorder, recurrent severe without psychotic features: Secondary | ICD-10-CM | POA: Diagnosis not present

## 2019-07-03 DIAGNOSIS — H9190 Unspecified hearing loss, unspecified ear: Secondary | ICD-10-CM | POA: Diagnosis not present

## 2019-07-03 DIAGNOSIS — Z23 Encounter for immunization: Secondary | ICD-10-CM | POA: Diagnosis not present

## 2019-07-03 DIAGNOSIS — E039 Hypothyroidism, unspecified: Secondary | ICD-10-CM | POA: Diagnosis not present

## 2019-07-03 DIAGNOSIS — I10 Essential (primary) hypertension: Secondary | ICD-10-CM | POA: Diagnosis not present

## 2019-07-03 DIAGNOSIS — Z79899 Other long term (current) drug therapy: Secondary | ICD-10-CM | POA: Diagnosis not present

## 2019-07-17 DIAGNOSIS — I10 Essential (primary) hypertension: Secondary | ICD-10-CM | POA: Diagnosis not present

## 2019-07-19 DIAGNOSIS — I10 Essential (primary) hypertension: Secondary | ICD-10-CM | POA: Diagnosis not present

## 2019-07-19 DIAGNOSIS — H6122 Impacted cerumen, left ear: Secondary | ICD-10-CM | POA: Diagnosis not present

## 2019-07-19 DIAGNOSIS — H9203 Otalgia, bilateral: Secondary | ICD-10-CM | POA: Diagnosis not present

## 2019-07-19 DIAGNOSIS — Z974 Presence of external hearing-aid: Secondary | ICD-10-CM | POA: Diagnosis not present

## 2019-07-25 DIAGNOSIS — F332 Major depressive disorder, recurrent severe without psychotic features: Secondary | ICD-10-CM | POA: Diagnosis not present

## 2019-08-20 DIAGNOSIS — R911 Solitary pulmonary nodule: Secondary | ICD-10-CM | POA: Diagnosis not present

## 2019-08-28 DIAGNOSIS — F329 Major depressive disorder, single episode, unspecified: Secondary | ICD-10-CM | POA: Diagnosis not present

## 2019-08-28 DIAGNOSIS — E119 Type 2 diabetes mellitus without complications: Secondary | ICD-10-CM | POA: Diagnosis not present

## 2019-08-28 DIAGNOSIS — Z79899 Other long term (current) drug therapy: Secondary | ICD-10-CM | POA: Diagnosis not present

## 2019-08-28 DIAGNOSIS — Z974 Presence of external hearing-aid: Secondary | ICD-10-CM | POA: Diagnosis not present

## 2019-09-07 DIAGNOSIS — H6122 Impacted cerumen, left ear: Secondary | ICD-10-CM | POA: Diagnosis not present

## 2019-09-07 DIAGNOSIS — H6121 Impacted cerumen, right ear: Secondary | ICD-10-CM | POA: Diagnosis not present

## 2019-09-07 DIAGNOSIS — H6123 Impacted cerumen, bilateral: Secondary | ICD-10-CM | POA: Diagnosis not present

## 2019-09-07 DIAGNOSIS — X58XXXA Exposure to other specified factors, initial encounter: Secondary | ICD-10-CM | POA: Diagnosis not present

## 2019-09-07 DIAGNOSIS — T161XXA Foreign body in right ear, initial encounter: Secondary | ICD-10-CM | POA: Diagnosis not present

## 2019-09-07 DIAGNOSIS — Z9089 Acquired absence of other organs: Secondary | ICD-10-CM | POA: Diagnosis not present

## 2019-09-07 DIAGNOSIS — H7201 Central perforation of tympanic membrane, right ear: Secondary | ICD-10-CM | POA: Diagnosis not present

## 2019-09-07 DIAGNOSIS — H903 Sensorineural hearing loss, bilateral: Secondary | ICD-10-CM | POA: Diagnosis not present
